# Patient Record
Sex: Male | Born: 1950 | Race: White | Hispanic: No | Marital: Married | Smoking: Former smoker
Health system: Southern US, Community
[De-identification: ages and names within clinical notes are randomized; demographics above are authoritative.]

## PROBLEM LIST (undated history)

## (undated) DIAGNOSIS — I639 Cerebral infarction, unspecified: Secondary | ICD-10-CM

## (undated) DIAGNOSIS — E119 Type 2 diabetes mellitus without complications: Secondary | ICD-10-CM

---

## 2019-12-05 ENCOUNTER — Other Ambulatory Visit: Payer: Self-pay

## 2019-12-05 DIAGNOSIS — Z20822 Contact with and (suspected) exposure to covid-19: Secondary | ICD-10-CM

## 2019-12-07 LAB — NOVEL CORONAVIRUS, NAA: SARS-CoV-2, NAA: NOT DETECTED

## 2019-12-09 ENCOUNTER — Telehealth: Payer: Self-pay

## 2019-12-09 NOTE — Telephone Encounter (Signed)
Caller given negative result and verbalized understanding  

## 2019-12-28 DIAGNOSIS — I480 Paroxysmal atrial fibrillation: Secondary | ICD-10-CM

## 2019-12-28 HISTORY — DX: Paroxysmal atrial fibrillation: I48.0

## 2021-03-13 DIAGNOSIS — I69351 Hemiplegia and hemiparesis following cerebral infarction affecting right dominant side: Secondary | ICD-10-CM

## 2021-03-13 HISTORY — DX: Hemiplegia and hemiparesis following cerebral infarction affecting right dominant side: I69.351

## 2021-07-17 ENCOUNTER — Encounter (HOSPITAL_BASED_OUTPATIENT_CLINIC_OR_DEPARTMENT_OTHER): Payer: Self-pay

## 2021-07-17 ENCOUNTER — Emergency Department (HOSPITAL_BASED_OUTPATIENT_CLINIC_OR_DEPARTMENT_OTHER): Payer: Medicare HMO

## 2021-07-17 ENCOUNTER — Other Ambulatory Visit: Payer: Self-pay

## 2021-07-17 ENCOUNTER — Inpatient Hospital Stay (HOSPITAL_BASED_OUTPATIENT_CLINIC_OR_DEPARTMENT_OTHER)
Admission: EM | Admit: 2021-07-17 | Discharge: 2021-07-23 | DRG: 690 | Disposition: A | Discharge status: Home / Self Care | Payer: Medicare HMO | Attending: Family Medicine | Admitting: Family Medicine

## 2021-07-17 DIAGNOSIS — R338 Other retention of urine: Secondary | ICD-10-CM | POA: Diagnosis present

## 2021-07-17 DIAGNOSIS — Z79899 Other long term (current) drug therapy: Secondary | ICD-10-CM

## 2021-07-17 DIAGNOSIS — K5641 Fecal impaction: Secondary | ICD-10-CM | POA: Diagnosis present

## 2021-07-17 DIAGNOSIS — E860 Dehydration: Secondary | ICD-10-CM | POA: Diagnosis present

## 2021-07-17 DIAGNOSIS — I4891 Unspecified atrial fibrillation: Secondary | ICD-10-CM | POA: Diagnosis present

## 2021-07-17 DIAGNOSIS — R310 Gross hematuria: Secondary | ICD-10-CM | POA: Diagnosis present

## 2021-07-17 DIAGNOSIS — Z794 Long term (current) use of insulin: Secondary | ICD-10-CM | POA: Diagnosis not present

## 2021-07-17 DIAGNOSIS — Z880 Allergy status to penicillin: Secondary | ICD-10-CM

## 2021-07-17 DIAGNOSIS — K7030 Alcoholic cirrhosis of liver without ascites: Secondary | ICD-10-CM | POA: Diagnosis present

## 2021-07-17 DIAGNOSIS — I10 Essential (primary) hypertension: Secondary | ICD-10-CM | POA: Diagnosis present

## 2021-07-17 DIAGNOSIS — E876 Hypokalemia: Secondary | ICD-10-CM | POA: Diagnosis not present

## 2021-07-17 DIAGNOSIS — Z87891 Personal history of nicotine dependence: Secondary | ICD-10-CM | POA: Diagnosis not present

## 2021-07-17 DIAGNOSIS — N401 Enlarged prostate with lower urinary tract symptoms: Secondary | ICD-10-CM | POA: Diagnosis present

## 2021-07-17 DIAGNOSIS — B952 Enterococcus as the cause of diseases classified elsewhere: Secondary | ICD-10-CM | POA: Diagnosis present

## 2021-07-17 DIAGNOSIS — E785 Hyperlipidemia, unspecified: Secondary | ICD-10-CM | POA: Diagnosis present

## 2021-07-17 DIAGNOSIS — N390 Urinary tract infection, site not specified: Secondary | ICD-10-CM | POA: Diagnosis not present

## 2021-07-17 DIAGNOSIS — L89510 Pressure ulcer of right ankle, unstageable: Secondary | ICD-10-CM | POA: Diagnosis present

## 2021-07-17 DIAGNOSIS — E119 Type 2 diabetes mellitus without complications: Secondary | ICD-10-CM | POA: Diagnosis present

## 2021-07-17 DIAGNOSIS — I69354 Hemiplegia and hemiparesis following cerebral infarction affecting left non-dominant side: Secondary | ICD-10-CM | POA: Diagnosis not present

## 2021-07-17 DIAGNOSIS — N179 Acute kidney failure, unspecified: Secondary | ICD-10-CM | POA: Diagnosis present

## 2021-07-17 DIAGNOSIS — Z20822 Contact with and (suspected) exposure to covid-19: Secondary | ICD-10-CM | POA: Diagnosis present

## 2021-07-17 DIAGNOSIS — Z8673 Personal history of transient ischemic attack (TIA), and cerebral infarction without residual deficits: Secondary | ICD-10-CM | POA: Diagnosis not present

## 2021-07-17 DIAGNOSIS — N136 Pyonephrosis: Secondary | ICD-10-CM | POA: Diagnosis present

## 2021-07-17 DIAGNOSIS — N1330 Unspecified hydronephrosis: Secondary | ICD-10-CM | POA: Diagnosis not present

## 2021-07-17 DIAGNOSIS — Z7401 Bed confinement status: Secondary | ICD-10-CM

## 2021-07-17 HISTORY — DX: Type 2 diabetes mellitus without complications: E11.9

## 2021-07-17 HISTORY — DX: Cerebral infarction, unspecified: I63.9

## 2021-07-17 LAB — CBC WITH DIFFERENTIAL/PLATELET
Abs Immature Granulocytes: 0.06 10*3/uL (ref 0.00–0.07)
Basophils Absolute: 0.1 10*3/uL (ref 0.0–0.1)
Basophils Relative: 0 %
Eosinophils Absolute: 0 10*3/uL (ref 0.0–0.5)
Eosinophils Relative: 0 %
HCT: 38.3 % — ABNORMAL LOW (ref 39.0–52.0)
Hemoglobin: 13.1 g/dL (ref 13.0–17.0)
Immature Granulocytes: 0 %
Lymphocytes Relative: 15 %
Lymphs Abs: 2.1 10*3/uL (ref 0.7–4.0)
MCH: 31.1 pg (ref 26.0–34.0)
MCHC: 34.2 g/dL (ref 30.0–36.0)
MCV: 91 fL (ref 80.0–100.0)
Monocytes Absolute: 1.1 10*3/uL — ABNORMAL HIGH (ref 0.1–1.0)
Monocytes Relative: 8 %
Neutro Abs: 10.2 10*3/uL — ABNORMAL HIGH (ref 1.7–7.7)
Neutrophils Relative %: 77 %
Platelets: 318 10*3/uL (ref 150–400)
RBC: 4.21 MIL/uL — ABNORMAL LOW (ref 4.22–5.81)
RDW: 12.7 % (ref 11.5–15.5)
WBC: 13.4 10*3/uL — ABNORMAL HIGH (ref 4.0–10.5)
nRBC: 0 % (ref 0.0–0.2)

## 2021-07-17 LAB — URINALYSIS, MICROSCOPIC (REFLEX): WBC, UA: >50 WBC/hpf (ref 0–5)

## 2021-07-17 LAB — URINALYSIS, ROUTINE W REFLEX MICROSCOPIC
Bilirubin Urine: NEGATIVE
Glucose, UA: NEGATIVE mg/dL
Ketones, ur: NEGATIVE mg/dL
Nitrite: NEGATIVE
Protein, ur: 100 mg/dL — AB
Specific Gravity, Urine: 1.02 (ref 1.005–1.030)
pH: 5 (ref 5.0–8.0)

## 2021-07-17 LAB — COMPREHENSIVE METABOLIC PANEL
ALT: 11 U/L (ref 0–44)
AST: 17 U/L (ref 15–41)
Albumin: 3.3 g/dL — ABNORMAL LOW (ref 3.5–5.0)
Alkaline Phosphatase: 66 U/L (ref 38–126)
Anion gap: 13 (ref 5–15)
BUN: 40 mg/dL — ABNORMAL HIGH (ref 8–23)
CO2: 23 mmol/L (ref 22–32)
Calcium: 9.3 mg/dL (ref 8.9–10.3)
Chloride: 94 mmol/L — ABNORMAL LOW (ref 98–111)
Creatinine, Ser: 2.57 mg/dL — ABNORMAL HIGH (ref 0.61–1.24)
GFR, Estimated: 26 mL/min — ABNORMAL LOW (ref >60)
Glucose, Bld: 127 mg/dL — ABNORMAL HIGH (ref 70–99)
Potassium: 4.7 mmol/L (ref 3.5–5.1)
Sodium: 130 mmol/L — ABNORMAL LOW (ref 135–145)
Total Bilirubin: 0.7 mg/dL (ref 0.3–1.2)
Total Protein: 8.3 g/dL — ABNORMAL HIGH (ref 6.5–8.1)

## 2021-07-17 LAB — LIPASE, BLOOD: Lipase: 102 U/L — ABNORMAL HIGH (ref 11–51)

## 2021-07-17 LAB — RESP PANEL BY RT-PCR (FLU A&B, COVID) ARPGX2
Influenza A by PCR: NEGATIVE
Influenza B by PCR: NEGATIVE
SARS Coronavirus 2 by RT PCR: NEGATIVE

## 2021-07-17 MED ORDER — ACETAMINOPHEN 650 MG RE SUPP: 650.0000 mg | Freq: Four times a day (QID) | RECTAL | Status: DC | PRN

## 2021-07-17 MED ORDER — ONDANSETRON HCL 4 MG/2ML IJ SOLN: 4.0000 mg | Freq: Four times a day (QID) | INTRAMUSCULAR | Status: DC | PRN

## 2021-07-17 MED ORDER — ONDANSETRON HCL 4 MG PO TABS: 4.0000 mg | ORAL_TABLET | Freq: Four times a day (QID) | ORAL | Status: DC | PRN

## 2021-07-17 MED ORDER — SODIUM CHLORIDE 0.9 % IV SOLN
1.0000 g | Freq: Once | INTRAVENOUS | Status: AC
  Administered 2021-07-17: 1 g via INTRAVENOUS
  Filled 2021-07-17: qty 10

## 2021-07-17 MED ORDER — METOPROLOL SUCCINATE ER 100 MG PO TB24
200.0000 mg | ORAL_TABLET | Freq: Every day | ORAL | Status: DC
  Administered 2021-07-18 – 2021-07-23 (×6): 200 mg via ORAL
  Filled 2021-07-17 (×6): qty 2

## 2021-07-17 MED ORDER — ACETAMINOPHEN 325 MG PO TABS: 650.0000 mg | ORAL_TABLET | Freq: Four times a day (QID) | ORAL | Status: DC | PRN

## 2021-07-17 MED ORDER — LISINOPRIL 20 MG PO TABS: 20.0000 mg | ORAL_TABLET | Freq: Every day | ORAL | Status: DC

## 2021-07-17 MED ORDER — SODIUM CHLORIDE 0.9 % IV SOLN
1.0000 g | INTRAVENOUS | Status: DC
  Administered 2021-07-18 – 2021-07-21 (×4): 1 g via INTRAVENOUS
  Filled 2021-07-17 (×4): qty 1

## 2021-07-17 MED ORDER — TRAZODONE HCL 50 MG PO TABS: 25.0000 mg | ORAL_TABLET | Freq: Every evening | ORAL | Status: DC | PRN

## 2021-07-17 MED ORDER — BISACODYL 5 MG PO TBEC: 5.0000 mg | DELAYED_RELEASE_TABLET | Freq: Every day | ORAL | Status: DC | PRN

## 2021-07-17 MED ORDER — MORPHINE SULFATE (PF) 2 MG/ML IV SOLN: 1.0000 mg | Freq: Four times a day (QID) | INTRAVENOUS | Status: DC | PRN

## 2021-07-17 MED ORDER — INSULIN ASPART 100 UNIT/ML IJ SOLN
0.0000 [IU] | Freq: Three times a day (TID) | INTRAMUSCULAR | Status: DC
  Administered 2021-07-18: 5 [IU] via SUBCUTANEOUS
  Administered 2021-07-18: 3 [IU] via SUBCUTANEOUS
  Administered 2021-07-18: 5 [IU] via SUBCUTANEOUS
  Administered 2021-07-19: 3 [IU] via SUBCUTANEOUS
  Administered 2021-07-19: 2 [IU] via SUBCUTANEOUS
  Administered 2021-07-19: 5 [IU] via SUBCUTANEOUS
  Administered 2021-07-20 (×2): 3 [IU] via SUBCUTANEOUS
  Administered 2021-07-20: 2 [IU] via SUBCUTANEOUS
  Administered 2021-07-21 – 2021-07-22 (×4): 3 [IU] via SUBCUTANEOUS
  Administered 2021-07-22: 8 [IU] via SUBCUTANEOUS
  Administered 2021-07-22: 2 [IU] via SUBCUTANEOUS
  Administered 2021-07-23: 5 [IU] via SUBCUTANEOUS
  Administered 2021-07-23 (×2): 2 [IU] via SUBCUTANEOUS

## 2021-07-17 MED ORDER — SODIUM CHLORIDE 0.9 % IV BOLUS
500.0000 mL | Freq: Once | INTRAVENOUS | Status: AC
  Administered 2021-07-17: 500 mL via INTRAVENOUS

## 2021-07-17 MED ORDER — HYDROCODONE-ACETAMINOPHEN 5-325 MG PO TABS: 1.0000 | ORAL_TABLET | ORAL | Status: DC | PRN

## 2021-07-17 MED ORDER — SODIUM CHLORIDE 0.9 % IV SOLN: INTRAVENOUS | Status: DC

## 2021-07-17 MED ORDER — MAGNESIUM CITRATE PO SOLN
1.0000 | Freq: Once | ORAL | Status: AC | PRN
  Administered 2021-07-20: 1 via ORAL
  Filled 2021-07-17: qty 296

## 2021-07-17 MED ORDER — INSULIN ASPART 100 UNIT/ML IJ SOLN
0.0000 [IU] | Freq: Every day | INTRAMUSCULAR | Status: DC
  Administered 2021-07-22: 2 [IU] via SUBCUTANEOUS

## 2021-07-17 MED ORDER — FUROSEMIDE 20 MG PO TABS: 20.0000 mg | ORAL_TABLET | Freq: Every day | ORAL | Status: DC

## 2021-07-17 MED ORDER — SENNOSIDES-DOCUSATE SODIUM 8.6-50 MG PO TABS: 1.0000 | ORAL_TABLET | Freq: Every evening | ORAL | Status: DC | PRN

## 2021-07-17 MED ORDER — ROSUVASTATIN CALCIUM 20 MG PO TABS
40.0000 mg | ORAL_TABLET | Freq: Every day | ORAL | Status: DC
  Administered 2021-07-18 – 2021-07-23 (×6): 40 mg via ORAL
  Filled 2021-07-17 (×6): qty 2

## 2021-07-17 MED ORDER — ENOXAPARIN SODIUM 30 MG/0.3ML IJ SOSY
30.0000 mg | PREFILLED_SYRINGE | INTRAMUSCULAR | Status: DC
  Administered 2021-07-18: 30 mg via SUBCUTANEOUS
  Filled 2021-07-17: qty 0.3

## 2021-07-17 MED ORDER — ALBUTEROL SULFATE (2.5 MG/3ML) 0.083% IN NEBU: 2.5000 mg | INHALATION_SOLUTION | Freq: Four times a day (QID) | RESPIRATORY_TRACT | Status: DC | PRN

## 2021-07-17 MED ORDER — IPRATROPIUM BROMIDE 0.02 % IN SOLN: 0.5000 mg | Freq: Four times a day (QID) | RESPIRATORY_TRACT | Status: DC | PRN

## 2021-07-17 NOTE — ED Provider Notes (Signed)
[facility] EMERGENCY DEPARTMENT Provider Note   CSN: [id number] Arrival date & time: 07/17/21  1058     History Chief Complaint  Patient presents with   Abdominal Pain    Dylan Park is a 70 y.o. male.  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  He presents with constipation.  He states he has not had a bowel movement in 12 days.  He is essentially bedbound after stroke about 3 months ago.  He does not have any prior history of constipation.  He does have some associated abdominal distention.  He does not report any abdominal pain.  No nausea or vomiting.  No fevers.  He has been using some over-the-counter medications at home including fleets enema without improvement in symptoms.  He has a slow healing wound on his right ankle that is being cared for through home health services.  No change in this.      Past Medical History:  Diagnosis Date   Diabetes mellitus without complication (HCC)    Stroke (HCC)     There are no problems to display for this patient.   History reviewed. No pertinent surgical history.     No family history on file.  Social History   Tobacco Use   Smoking status: Former    Types: Cigarettes   Smokeless tobacco: Never  Vaping Use   Vaping Use: Never used  Substance Use Topics   Alcohol use: Never   Drug use: Never    Home Medications Prior to Admission medications   Not on File    Allergies    Penicillins  Review of Systems   Review of Systems  Constitutional:  Negative for chills, diaphoresis, fatigue and fever.  HENT:  Negative for congestion, rhinorrhea and sneezing.   Eyes: Negative.   Respiratory:  Negative for cough, chest tightness and shortness of breath.   Cardiovascular:  Negative for chest pain and leg swelling.  Gastrointestinal:  Positive for abdominal distention and constipation. Negative for abdominal  pain, blood in stool, diarrhea, nausea and vomiting.  Genitourinary:  Negative for difficulty urinating, flank pain, frequency and hematuria.  Musculoskeletal:  Negative for arthralgias and back pain.  Skin:  Negative for rash.  Neurological:  Negative for dizziness, speech difficulty, weakness, numbness and headaches.   Physical Exam Updated Vital Signs BP 119/81 (BP Location: Right Arm)   Pulse 93   Temp 99 F (37.2 C) (Oral)   Resp 16   Ht 5\' 11"  (1.803 m)   Wt 77.1 kg   SpO2 97%   BMI 23.71 kg/m   Physical Exam Constitutional:      Appearance: He is well-developed.  HENT:     Head: Normocephalic and atraumatic.  Eyes:     Pupils: Pupils are equal, round, and reactive to light.  Cardiovascular:     Rate and Rhythm: Normal rate and regular rhythm.     Heart sounds: Normal heart sounds.  Pulmonary:     Effort: Pulmonary effort is normal. No respiratory distress.     Breath sounds: Normal breath sounds. No wheezing or rales.  Chest:     Chest wall: No tenderness.  Abdominal:     General: Bowel sounds are normal.     Palpations: Abdomen is soft.     Tenderness: There is generalized abdominal tenderness. There is no guarding or rebound.  Genitourinary:    Comments: Patient does have  some hard stool high up in the rectal vault. Musculoskeletal:        General: Normal range of motion.     Cervical back: Normal range of motion and neck supple.  Lymphadenopathy:     Cervical: No cervical adenopathy.  Skin:    General: Skin is warm and dry.     Findings: No rash.  Neurological:     Mental Status: He is alert and oriented to person, place, and time.    ED Results / Procedures / Treatments   Labs (all labs ordered are listed, but only abnormal results are displayed) Labs Reviewed  COMPREHENSIVE METABOLIC PANEL - Abnormal; Notable for the following components:      Result Value   Sodium 130 (*)    Chloride 94 (*)    Glucose, Bld 127 (*)    BUN 40 (*)    Creatinine,  Ser 2.57 (*)    Total Protein 8.3 (*)    Albumin 3.3 (*)    GFR, Estimated 26 (*)    All other components within normal limits  LIPASE, BLOOD - Abnormal; Notable for the following components:   Lipase 102 (*)    All other components within normal limits  CBC WITH DIFFERENTIAL/PLATELET - Abnormal; Notable for the following components:   WBC 13.4 (*)    RBC 4.21 (*)    HCT 38.3 (*)    Neutro Abs 10.2 (*)    Monocytes Absolute 1.1 (*)    All other components within normal limits    EKG None  Radiology No results found.  Procedures Procedures   Medications Ordered in ED Medications - No data to display  ED Course  I have reviewed the triage vital signs and the nursing notes.  Pertinent labs & imaging results that were available during my care of the patient were reviewed by me and considered in my medical decision making (see chart for details).    MDM Rules/Calculators/A&P                           Patient is a 70 year old male who presents with constipation.  He has no prior history of constipation.  No vomiting.  He denies any abdominal pain but did have some tenderness on exam.  His white count is slightly elevated.  His creatinine is elevated as compared to his prior normal values.  CT scan was ordered and is pending.  Dr. Madilyn Hook to take over care. Final Clinical Impression(s) / ED Diagnoses Final diagnoses:  None    Rx / DC Orders ED Discharge Orders     None        Rolan Bucco, MD 07/18/21 [phone]

## 2021-07-17 NOTE — ED Notes (Signed)
Pt states that he is bed bound at home after his stroke about 3 months ago, was cared for at [facility] for the stroke. Pt is poor historian but reports wife is on the way. Also unsure of medications he is currently on.

## 2021-07-17 NOTE — ED Notes (Signed)
Pt. In xray 

## 2021-07-17 NOTE — ED Triage Notes (Signed)
Pt arrives from home with c/o constipation X12 days with abdominal distention. Pt also has right foot bandaged. Family reports using OTC medications for constipation, none working. Per EMS  Pt has firm area to touch in middle of abdomen above umbilical area. Pt denies any vomiting.

## 2021-07-17 NOTE — H&P (Signed)
History and Physical   [facility] [place] [facility] [place] [facility] Admission History and Physical [employer], D.O.    Patient Name: Dylan Park MR#: [id number] Date of Birth: [birth year] Date of Admission: 07/17/2021  Referring MD/NP/PA: [facility] Primary Care Physician: Elijio Miles., MD  Chief Complaint:  Chief Complaint  Patient presents with   Abdominal Pain    HPI: Dylan Park is a 70 y.o. male with a known history of atrial flutter/fibrillation on Eliquis, alcoholic liver disease, CVA with residual hemiparesis, bedbound for 3 months, diabetes, CHF presents to the emergency department for evaluation of constipation, stated abdominal pain.  Patient reports he has not had a bowel movement in 12 days.  Symptoms are refractory to home enemas  Of note he has home care for ongoing treatment of a right ankle wound  Patient denies fevers/chills, weakness, dizziness, chest pain, shortness of breath, N/V/C/D, abdominal pain, dysuria/frequency, changes in mental status.    Otherwise there has been no change in status. Patient has been taking medication as prescribed and there has been no recent change in medication or diet.  No recent antibiotics.  There has been no recent illness, hospitalizations, travel or sick contacts.    EMS/ED Course:Medical admission has been requested for further management of fecal impaction, urinary retention likely secondary to stool ball, AKI secondary to dehydration and urinary tract infection..  Review of Systems:  CONSTITUTIONAL: No fever/chills, fatigue, weakness, weight gain/loss, headache. EYES: No blurry or double vision. ENT: No tinnitus, postnasal drip, redness or soreness of the oropharynx. RESPIRATORY: No cough, dyspnea, wheeze.  No hemoptysis.  CARDIOVASCULAR: No chest pain, palpitations, syncope, orthopnea. No lower extremity edema.  GASTROINTESTINAL: Positive abdominal pain, constipation no nausea, vomiting,.  No  hematemesis, melena or hematochezia. GENITOURINARY: No dysuria, frequency, hematuria. ENDOCRINE: No polyuria or nocturia. No heat or cold intolerance. HEMATOLOGY: No anemia, bruising, bleeding. INTEGUMENTARY: No rashes, ulcers, lesions. MUSCULOSKELETAL: No arthritis, gout, dyspnea. NEUROLOGIC: No numbness, tingling, ataxia, seizure-type activity, weakness. PSYCHIATRIC: No anxiety, depression, insomnia.   Past Medical History:  Diagnosis Date   Diabetes mellitus without complication (HCC)    Stroke [facility])     History reviewed. No pertinent surgical history.   reports that he has quit smoking. His smoking use included cigarettes. He has never used smokeless tobacco. He reports that he does not drink alcohol and does not use drugs.  Allergies  Allergen Reactions   Penicillins Rash    No family history on file.  Prior to Admission medications   Medication Sig Start Date End Date Taking? Authorizing Provider  furosemide (LASIX) 20 MG tablet Take by mouth. 05/19/21   [provider]  ketoconazole (NIZORAL) 2 % cream Apply topically. 06/20/21   [provider]  LEVEMIR 100 UNIT/ML injection Inject into the skin. 05/04/21   [provider]  lisinopril (ZESTRIL) 20 MG tablet Take 20 mg by mouth daily. 05/05/21   [provider]  metoprolol (TOPROL-XL) 200 MG 24 hr tablet Take 200 mg by mouth daily. 04/19/21   [provider]  rosuvastatin (CRESTOR) 40 MG tablet Take 40 mg by mouth daily. 06/29/21   [provider]  SANTYL ointment Apply topically. 07/08/21   [provider]    Physical Exam: Vitals:   07/17/21 1930 07/17/21 2046 07/17/21 2100 07/17/21 2205  BP: 116/81 125/77 124/80 116/75  Pulse: (!) 103 (!) 107 (!) 103 (!) 103  Resp: 20 18 20 18   Temp:    98.7 F (37.1  C)  TempSrc:    Oral  SpO2: 95% 95% 95% 98%  Weight:    74.3 kg  Height:    5\' 11"  (1.803 m)    GENERAL: 70 y.o.-year-old chronically ill-appearing male  patient, well-developed, well-nourished lying in the bed in no acute distress.  Pleasant and cooperative.   HEENT: Head atraumatic, normocephalic. Pupils equal. Mucus membranes moist. NECK: Supple. No JVD. CHEST: Normal breath sounds bilaterally. No wheezing, rales, rhonchi or crackles. No use of accessory muscles of respiration.  No reproducible chest wall tenderness.  CARDIOVASCULAR: S1, S2 normal. No murmurs, rubs, or gallops. Cap refill <2 seconds. Pulses intact distally.  ABDOMEN: Soft, nondistended, nontender. No rebound, guarding, rigidity. Normoactive bowel sounds present in all four quadrants.  EXTREMITIES: Lower extremity wound.  No pedal edema, cyanosis, or clubbing. No calf tenderness or Homan's sign.  NEUROLOGIC: The patient is alert and oriented x 3.  PSYCHIATRIC:  Normal affect, mood, thought content. SKIN: Warm, dry, and intact without obvious rash, lesion, or ulcer.    Labs on Admission:  CBC: Recent Labs  Lab 07/17/21 1124  WBC 13.4*  NEUTROABS 10.2*  HGB 13.1  HCT 38.3*  MCV 91.0  PLT 318   Basic Metabolic Panel: Recent Labs  Lab 07/17/21 1124  NA 130*  K 4.7  CL 94*  CO2 23  GLUCOSE 127*  BUN 40*  CREATININE 2.57*  CALCIUM 9.3   GFR: Estimated Creatinine Clearance: 28.5 mL/min (A) (by C-G formula based on SCr of 2.57 mg/dL (H)). Liver Function Tests: Recent Labs  Lab 07/17/21 1124  AST 17  ALT 11  ALKPHOS 66  BILITOT 0.7  PROT 8.3*  ALBUMIN 3.3*   Recent Labs  Lab 07/17/21 1124  LIPASE 102*   No results for input(s): AMMONIA in the last 168 hours. Coagulation Profile: No results for input(s): INR, PROTIME in the last 168 hours. Cardiac Enzymes: No results for input(s): CKTOTAL, CKMB, CKMBINDEX, TROPONINI in the last 168 hours. BNP (last 3 results) No results for input(s): PROBNP in the last 8760 hours. HbA1C: No results for input(s): HGBA1C in the last 72 hours. CBG: No results for input(s): GLUCAP in the last 168 hours. Lipid  Profile: No results for input(s): CHOL, HDL, LDLCALC, TRIG, CHOLHDL, LDLDIRECT in the last 72 hours. Thyroid Function Tests: No results for input(s): TSH, T4TOTAL, FREET4, T3FREE, THYROIDAB in the last 72 hours. Anemia Panel: No results for input(s): VITAMINB12, FOLATE, FERRITIN, TIBC, IRON, RETICCTPCT in the last 72 hours. Urine analysis:    Component Value Date/Time   COLORURINE YELLOW 07/17/2021 1540   APPEARANCEUR TURBID (A) 07/17/2021 1540   LABSPEC 1.020 07/17/2021 1540   PHURINE 5.0 07/17/2021 1540   GLUCOSEU NEGATIVE 07/17/2021 1540   HGBUR LARGE (A) 07/17/2021 1540   BILIRUBINUR NEGATIVE 07/17/2021 1540   KETONESUR NEGATIVE 07/17/2021 1540   PROTEINUR 100 (A) 07/17/2021 1540   NITRITE NEGATIVE 07/17/2021 1540   LEUKOCYTESUR LARGE (A) 07/17/2021 1540   Sepsis Labs: @LABRCNTIP (procalcitonin:4,lacticidven:4) ) Recent Results (from the past 240 hour(s))  Resp Panel by RT-PCR (Flu A&B, Covid) Nasopharyngeal Swab     Status: None   Collection Time: 07/17/21  4:05 PM   Specimen: Nasopharyngeal Swab; Nasopharyngeal(NP) swabs in vial transport medium  Result Value Ref Range Status   SARS Coronavirus 2 by RT PCR NEGATIVE NEGATIVE Final    Comment: (NOTE) SARS-CoV-2 target nucleic acids are NOT DETECTED.  The SARS-CoV-2 RNA is generally detectable in upper respiratory specimens during the acute phase of infection. The lowest  concentration of SARS-CoV-2 viral copies this assay can detect is 138 copies/mL. A negative result does not preclude SARS-Cov-2 infection and should not be used as the sole basis for treatment or other patient management decisions. A negative result may occur with  improper specimen collection/handling, submission of specimen other than nasopharyngeal swab, presence of viral mutation(s) within the areas targeted by this assay, and inadequate number of viral copies(<138 copies/mL). A negative result must be combined with clinical observations, patient  history, and epidemiological information. The expected result is Negative.  Fact Sheet for Patients:  BloggerCourse.comhttps://www.fda.gov/media/152166/download  Fact Sheet for Healthcare Providers:  SeriousBroker.ithttps://www.fda.gov/media/152162/download  This test is no t yet approved or cleared by the [place] FDA and  has been authorized for detection and/or diagnosis of SARS-CoV-2 by FDA under an Emergency Use Authorization (EUA). This EUA will remain  in effect (meaning this test can be used) for the duration of the COVID-19 declaration under Section 564(b)(1) of the Act, 21 U.S.C.section 360bbb-3(b)(1), unless the authorization is terminated  or revoked sooner.       Influenza A by PCR NEGATIVE NEGATIVE Final   Influenza B by PCR NEGATIVE NEGATIVE Final    Comment: (NOTE) The Xpert Xpress SARS-CoV-2/FLU/RSV plus assay is intended as an aid in the diagnosis of influenza from Nasopharyngeal swab specimens and should not be used as a sole basis for treatment. Nasal washings and aspirates are unacceptable for Xpert Xpress SARS-CoV-2/FLU/RSV testing.  Fact Sheet for Patients: BloggerCourse.comhttps://www.fda.gov/media/152166/download  Fact Sheet for Healthcare Providers: SeriousBroker.ithttps://www.fda.gov/media/152162/download  This test is not yet approved or cleared by the [place] FDA and has been authorized for detection and/or diagnosis of SARS-CoV-2 by FDA under an Emergency Use Authorization (EUA). This EUA will remain in effect (meaning this test can be used) for the duration of the COVID-19 declaration under Section 564(b)(1) of the Act, 21 U.S.C. section 360bbb-3(b)(1), unless the authorization is terminated or revoked.  Performed at [facility], [address] [place], [place] [address]      Radiological Exams on Admission: CT Abdomen Pelvis Wo Contrast  Result Date: 07/17/2021 CLINICAL DATA:  Abdominal distension, constipation. EXAM: CT ABDOMEN AND PELVIS WITHOUT CONTRAST TECHNIQUE:  Multidetector CT imaging of the abdomen and pelvis was performed following the standard protocol without IV contrast. COMPARISON:  None. FINDINGS: Lower chest: No acute abnormality. Hepatobiliary: Cholelithiasis is noted. No biliary dilatation is noted. The liver is unremarkable. Pancreas: Unremarkable. No pancreatic ductal dilatation or surrounding inflammatory changes. Spleen: Normal in size without focal abnormality. Adrenals/Urinary Tract: Adrenal glands appear normal. Mild bilateral hydroureteronephrosis is noted. Severe urinary bladder distention is noted concerning for bladder outlet obstruction. Stomach/Bowel: The stomach appears normal. No small bowel dilatation is noted. Large amount of stool seen throughout the colon. The appendix appears normal. Large amount of stool is noted in the rectum concerning for rectal impaction. Vascular/Lymphatic: Aortic atherosclerosis. No enlarged abdominal or pelvic lymph nodes. Reproductive: Mild prostatic enlargement is noted. Other: No abdominal wall hernia or abnormality. No abdominopelvic ascites. Musculoskeletal: No acute or significant osseous findings. IMPRESSION: Mild bilateral hydroureteronephrosis is noted secondary to severe urinary bladder distension, which in turn most likely is due to bladder outlet obstruction, potentially due to enlarged prostate gland. Cholelithiasis. Large amount of stool seen in the rectum concerning for rectal impaction. Aortic Atherosclerosis (ICD10-I70.0). Electronically Signed   By: Lupita RaiderJames  Green Jr M.D.   On: 07/17/2021 14:52   DG ABD ACUTE 2+V W 1V CHEST  Result Date: 07/17/2021 CLINICAL DATA:  Abdominal  pain and constipation EXAM: DG ABDOMEN ACUTE WITH 1 VIEW CHEST COMPARISON:  11/19/2013 FINDINGS: Cardiac shadow is within normal limits. The lungs are well aerated bilaterally. Vascular calcifications are seen. No acute bony abnormality is noted. Scattered large and small bowel gas is noted. Retained fecal material is noted in  the right colon consistent with a mild degree of constipation. Heavy vascular calcifications are noted. No free air is noted. No obstructive changes are seen. IMPRESSION: Changes of mild constipation. Aortic Atherosclerosis (ICD10-I70.0). Electronically Signed   By: Alcide Clever M.D.   On: 07/17/2021 12:07     Assessment/Plan  This is a 69 yo M history of: atrial flutter/fibrillation, alcoholic liver disease, CVA with residual hemiparesis, bedbound for 3 months, diabetes, CHF now being admitted for  #.  Fecal impaction -Admit inpatient - Stool regimen  #.  AKI secondary to dehydration  - IV fluids and repeat BMP in AM.  - Avoid nephrotoxic medications  #.  Urinary tract infection - IV Rocephin  #.  Urinary retention with bilateral hydronephrosis - Foley catheter in place - Check Is/Os  #. History of hypertension/CHF - Continue Lasix, lisinopril, metoprolol  #. History of hyperlipidemia - Continue Crestor  #. History of atrial fibrillation - Continue Toprol  Admission status: Inpatient IV Fluids: Gentle normal saline Diet/Nutrition: Heart healthy, carb controlled Consults called: None DVT Px: Lovenox, SCDs and early ambulation. Code Status: Full Code  Disposition Plan: To home in 1-2 days  All the records are reviewed and case discussed with ED provider. Management plans discussed with the patient and/or family who express understanding and agree with plan of care.  Tonye Royalty D.O. on 07/17/2021 at 10:33 PM CC: Primary care physician; Elijio Miles., MD   07/17/2021, 10:33 PM

## 2021-07-17 NOTE — ED Provider Notes (Signed)
Patient care assumed at 1500.  Pt here with abdominal pain, constipation.  CT pending.    CT demonstrates enlarged bladder with bilateral hydronephrosis as well as stool ball. Foley catheter was placed for urinary retention. UA is consistent with UTI - will start antibiotics. Will treat with enema for constipation. Discussed with patient and wife findings of studies and recommendation for admission for ongoing treatment. Hospitalist consulted for admission.   Tilden Fossa, MD 07/17/21 1806

## 2021-07-17 NOTE — ED Notes (Signed)
Pt has no rectal tone or ability to hold instilled enema.Dylan Park colored water returned after enema however no BM present.

## 2021-07-17 NOTE — ED Notes (Signed)
Non stick dressing placed to pressure wound on R lateral ankle malleolus. Measures approx 1.8cm x 1.3cm oval shaped stage 3.

## 2021-07-18 ENCOUNTER — Encounter (HOSPITAL_COMMUNITY): Payer: Self-pay | Admitting: Family Medicine

## 2021-07-18 LAB — HEMOGLOBIN A1C
Hgb A1c MFr Bld: 6.3 % — ABNORMAL HIGH (ref 4.8–5.6)
Mean Plasma Glucose: 134.11 mg/dL

## 2021-07-18 LAB — COMPREHENSIVE METABOLIC PANEL
ALT: 11 U/L (ref 0–44)
AST: 14 U/L — ABNORMAL LOW (ref 15–41)
Albumin: 3.1 g/dL — ABNORMAL LOW (ref 3.5–5.0)
Alkaline Phosphatase: 62 U/L (ref 38–126)
Anion gap: 18 — ABNORMAL HIGH (ref 5–15)
BUN: 42 mg/dL — ABNORMAL HIGH (ref 8–23)
CO2: 22 mmol/L (ref 22–32)
Calcium: 9.8 mg/dL (ref 8.9–10.3)
Chloride: 96 mmol/L — ABNORMAL LOW (ref 98–111)
Creatinine, Ser: 2.33 mg/dL — ABNORMAL HIGH (ref 0.61–1.24)
GFR, Estimated: 30 mL/min — ABNORMAL LOW (ref >60)
Glucose, Bld: 194 mg/dL — ABNORMAL HIGH (ref 70–99)
Potassium: 5.6 mmol/L — ABNORMAL HIGH (ref 3.5–5.1)
Sodium: 136 mmol/L (ref 135–145)
Total Bilirubin: 0.8 mg/dL (ref 0.3–1.2)
Total Protein: 7.7 g/dL (ref 6.5–8.1)

## 2021-07-18 LAB — CBC
HCT: 42.5 % (ref 39.0–52.0)
Hemoglobin: 13.9 g/dL (ref 13.0–17.0)
MCH: 30.2 pg (ref 26.0–34.0)
MCHC: 32.7 g/dL (ref 30.0–36.0)
MCV: 92.4 fL (ref 80.0–100.0)
Platelets: 340 10*3/uL (ref 150–400)
RBC: 4.6 MIL/uL (ref 4.22–5.81)
RDW: 12.9 % (ref 11.5–15.5)
WBC: 18 10*3/uL — ABNORMAL HIGH (ref 4.0–10.5)
nRBC: 0 % (ref 0.0–0.2)

## 2021-07-18 LAB — HEMOGLOBIN AND HEMATOCRIT, BLOOD
HCT: 34.6 % — ABNORMAL LOW (ref 39.0–52.0)
HCT: 33.6 % — ABNORMAL LOW (ref 39.0–52.0)
Hemoglobin: 11.6 g/dL — ABNORMAL LOW (ref 13.0–17.0)
Hemoglobin: 11.2 g/dL — ABNORMAL LOW (ref 13.0–17.0)

## 2021-07-18 LAB — BASIC METABOLIC PANEL
Anion gap: 10 (ref 5–15)
BUN: 46 mg/dL — ABNORMAL HIGH (ref 8–23)
CO2: 24 mmol/L (ref 22–32)
Calcium: 8.6 mg/dL — ABNORMAL LOW (ref 8.9–10.3)
Chloride: 100 mmol/L (ref 98–111)
Creatinine, Ser: 2.35 mg/dL — ABNORMAL HIGH (ref 0.61–1.24)
GFR, Estimated: 29 mL/min — ABNORMAL LOW (ref >60)
Glucose, Bld: 171 mg/dL — ABNORMAL HIGH (ref 70–99)
Potassium: 4.8 mmol/L (ref 3.5–5.1)
Sodium: 134 mmol/L — ABNORMAL LOW (ref 135–145)

## 2021-07-18 LAB — HIV ANTIBODY (ROUTINE TESTING W REFLEX): HIV Screen 4th Generation wRfx: NONREACTIVE

## 2021-07-18 LAB — GLUCOSE, CAPILLARY
Glucose-Capillary: 161 mg/dL — ABNORMAL HIGH (ref 70–99)
Glucose-Capillary: 175 mg/dL — ABNORMAL HIGH (ref 70–99)
Glucose-Capillary: 198 mg/dL — ABNORMAL HIGH (ref 70–99)
Glucose-Capillary: 203 mg/dL — ABNORMAL HIGH (ref 70–99)
Glucose-Capillary: 232 mg/dL — ABNORMAL HIGH (ref 70–99)

## 2021-07-18 MED ORDER — SODIUM CHLORIDE 0.9 % IV BOLUS
500.0000 mL | Freq: Once | INTRAVENOUS | Status: AC
  Administered 2021-07-18: 500 mL via INTRAVENOUS

## 2021-07-18 MED ORDER — POLYETHYLENE GLYCOL 3350 17 G PO PACK
17.0000 g | PACK | Freq: Every day | ORAL | Status: DC
  Administered 2021-07-18 – 2021-07-20 (×3): 17 g via ORAL
  Filled 2021-07-18 (×3): qty 1

## 2021-07-18 MED ORDER — BISACODYL 10 MG RE SUPP
10.0000 mg | Freq: Once | RECTAL | Status: AC
  Administered 2021-07-18: 10 mg via RECTAL
  Filled 2021-07-18: qty 1

## 2021-07-18 MED ORDER — CHLORHEXIDINE GLUCONATE CLOTH 2 % EX PADS
6.0000 | MEDICATED_PAD | Freq: Every day | CUTANEOUS | Status: DC
  Administered 2021-07-18 – 2021-07-23 (×6): 6 via TOPICAL

## 2021-07-18 NOTE — Progress Notes (Addendum)
PROGRESS NOTE    Dylan Park  QQV:[id number] DOB: Jan 30, 1951 DOA: 07/17/2021 PCP: Elijio Miles., MD   Chief Complaint  Patient presents with   Abdominal Pain   Brief Narrative: 70 year old male with history of A. fib/stroke bedbound due to stroke x3 months, alcoholic liver disease, diabetes, hypertension, hyperlipidemia, history of CHF seen in the ED due to complaint of constipation abdominal pain for 1 days refractory to home. He was seen in the ED, found to have fecal impaction constipation, AKI hypokalemia, Foley cath was placed and he was tried and was admitted for further management.  Subjective: Seen and examined this morning.  He is alert awake sluggish slow to respond but appears baseline denies any abdominal pain. Has a Foley catheter in place but appears to have gross hematuria, suprapubic area not distended  Assessment & Plan:  AKI. 2/2 dehydration and obstructive uropathy. Baseline creat 0.8 3/39/22 Obstructive uropathy with bilateral hydronephrosis, distended bladder Urine retention BPH Reviewed CT imaging, Foley was placed in the ED having good urine output but grossly hematuric. Creatinine remains stable continue Foley drainage-having good output since Foley with 3400 mL., keep on  IV fluid hydration.  Continue to hold his lisinopril and Lasix. Recent Labs  Lab 07/17/21 1124 07/18/21 0452 07/18/21 0924  CREATININE 2.57* 2.33* 2.35*    Recent Labs  Lab 07/17/21 1124 07/18/21 0452 07/18/21 0924  BUN 40* 42* 46*  CREATININE 2.57* 2.33* 2.35*   Intake/Output Summary (Last 24 hours) at 07/18/2021 1135 Last data filed at 07/18/2021 1030 Gross per 24 hour  Intake 679.32 ml  Output 3400 ml  Net -2720.68 ml     Gross hematuria, likely traumatic from Foley insertion in the setting of BPH: I have discontinued Lovenox, SCD, Protonix and from nephrology consulted continue hydration Ensure Foley is draining.  UTI POA Leukocytosis Continue empiric ceftriaxone, WBC  trending up continue to monitor follow-up culture Recent Labs  Lab 07/17/21 1124 07/18/21 0452  WBC 13.4* 18.0*   Fecal impaction/constipation Enema has been tried.  Add rectal suppository, laxatives.  Can obtain x-ray.  Resolved.  Hypokalemia has resolved her transient hyperkalemia shows resolution on repeat lab  Hyperlipidemia on Crestor  History of A. fib on Toprol  History of a stroke/bedbound Not on antiplatelets or anticoagulation on statin.  Pressure injury POA-.  See below wound care consult Diet Order             Diet heart healthy/carb modified Room service appropriate? Yes; Fluid consistency: Thin  Diet effective now                  Patient's Body mass index is 22.85 kg/m. caloric intake. Pressure Injury 07/17/21 Ankle Right;Lateral Unstageable - Full thickness tissue loss in which the base of the injury is covered by slough (yellow, tan, gray, green or brown) and/or eschar (tan, brown or black) in the wound bed. (Active)  07/17/21 2056  Location: Ankle  Location Orientation: Right;Lateral  Staging: Unstageable - Full thickness tissue loss in which the base of the injury is covered by slough (yellow, tan, gray, green or brown) and/or eschar (tan, brown or black) in the wound bed.  Wound Description (Comments):   Present on Admission: Yes    DVT prophylaxis: SCDs Start: 07/17/21 2251 no chemical prophylaxis due to hematuria Code Status:   Code Status: Full Code  Family Communication: plan of care discussed with patient at bedside. Status is: Inpatient  Remains inpatient appropriate because:IV treatments appropriate due to intensity of illness  or inability to take PO and Inpatient level of care appropriate due to severity of illness  Dispo: The patient is from: Home              Anticipated d/c is to: SNF              Patient currently is not medically stable to d/c.   Difficult to place patient No Unresulted Labs (From admission, onward)    None       Medications reviewed:  Scheduled Meds:  Chlorhexidine Gluconate Cloth  6 each Topical Daily   insulin aspart  0-15 Units Subcutaneous TID WC   insulin aspart  0-5 Units Subcutaneous QHS   metoprolol  200 mg Oral Daily   rosuvastatin  40 mg Oral Daily   Continuous Infusions:  sodium chloride 10 mL/hr at 07/18/21 0042   cefTRIAXone (ROCEPHIN)  IV     Consultants:see note  Procedures:see note Antimicrobials: Anti-infectives (From admission, onward)    Start     Dose/Rate Route Frequency Ordered Stop   07/18/21 1700  cefTRIAXone (ROCEPHIN) 1 g in sodium chloride 0.9 % 100 mL IVPB        1 g 200 mL/hr over 30 Minutes Intravenous Every 24 hours 07/17/21 2250     07/17/21 1715  cefTRIAXone (ROCEPHIN) 1 g in sodium chloride 0.9 % 100 mL IVPB        1 g 200 mL/hr over 30 Minutes Intravenous  Once 07/17/21 1711 07/17/21 1754      Culture/Microbiology    Component Value Date/Time   SDES  07/17/2021 1540    URINE, CATHETERIZED Performed at [facility], [address] [place], [place] [address]    [facility]  07/17/2021 1540    STERILE CONTAINER Performed at [facility], [address], [place], [place] [address]    CULT PENDING 07/17/2021 1540   REPTSTATUS PENDING 07/17/2021 1540    Other culture-see note  Objective: Vitals: Today's Vitals   07/18/21 0154 07/18/21 0603 07/18/21 0800 07/18/21 0933  BP: 112/75 117/78  120/74  Pulse: 94 84  86  Resp: 18 18  18   Temp: 98.6 F (37 C) 98.3 F (36.8 C)  98 F (36.7 C)  TempSrc: Oral Oral  Oral  SpO2: 99% 97%  100%  Weight:      Height:      PainSc:   0-No pain     Intake/Output Summary (Last 24 hours) at 07/18/2021 1020 Last data filed at 07/18/2021 0600 Gross per 24 hour  Intake 646.97 ml  Output 3400 ml  Net -2753.03 ml   Filed Weights   07/17/21 1104 07/17/21 2205  Weight: 77.1 kg 74.3 kg   Weight change:   Intake/Output from previous day: 07/20 0701 - 07/21 0700 In: 647 [I.V.:43.9; IV  Piggyback:603] Out: 3400 [Urine:3400] Intake/Output this shift: No intake/output data recorded. Filed Weights   07/17/21 1104 07/17/21 2205  Weight: 77.1 kg 74.3 kg   Examination: General exam: AAO at baseline, appears frail looking older than stated age. HEENT:Oral mucosa moist, Ear/Nose WNL grossly,dentition normal. Respiratory system: bilaterally diminished,no use of accessory muscle, non tender. Cardiovascular system: S1 & S2 +,No JVD. Gastrointestinal system: Abdomen soft, NT,ND, BS+.Foley catheter in place but appears to have gross hematuria, suprapubic area not distended Nervous System:Alert, awake, moving extremities Extremities: no edema, distal peripheral pulses palpable.  Skin: No rashes,no icterus. MSK: Normal muscle bulk,tone, power Data Reviewed: I have personally reviewed following labs and imaging studies  CBC: Recent Labs  Lab 07/17/21 1124 07/18/21 0452  WBC 13.4* 18.0*  NEUTROABS 10.2*  --   HGB 13.1 13.9  HCT 38.3* 42.5  MCV 91.0 92.4  PLT 318 340   Basic Metabolic Panel: Recent Labs  Lab 07/17/21 1124 07/18/21 0452 07/18/21 0924  NA 130* 136 134*  K 4.7 5.6* 4.8  CL 94* 96* 100  CO2 23 22 24   GLUCOSE 127* 194* 171*  BUN 40* 42* 46*  CREATININE 2.57* 2.33* 2.35*  CALCIUM 9.3 9.8 8.6*   GFR: Estimated Creatinine Clearance: 31.2 mL/min (A) (by C-G formula based on SCr of 2.35 mg/dL (H)). Liver Function Tests: Recent Labs  Lab 07/17/21 1124 07/18/21 0452  AST 17 14*  ALT 11 11  ALKPHOS 66 62  BILITOT 0.7 0.8  PROT 8.3* 7.7  ALBUMIN 3.3* 3.1*   Recent Labs  Lab 07/17/21 1124  LIPASE 102*   No results for input(s): AMMONIA in the last 168 hours. Coagulation Profile: No results for input(s): INR, PROTIME in the last 168 hours. Cardiac Enzymes: No results for input(s): CKTOTAL, CKMB, CKMBINDEX, TROPONINI in the last 168 hours. BNP (last 3 results) No results for input(s): PROBNP in the last 8760 hours. HbA1C: Recent Labs     07/18/21 0452  HGBA1C 6.3*   CBG: Recent Labs  Lab 07/18/21 0010 07/18/21 0723  GLUCAP 175* 198*   Lipid Profile: No results for input(s): CHOL, HDL, LDLCALC, TRIG, CHOLHDL, LDLDIRECT in the last 72 hours. Thyroid Function Tests: No results for input(s): TSH, T4TOTAL, FREET4, T3FREE, THYROIDAB in the last 72 hours. Anemia Panel: No results for input(s): VITAMINB12, FOLATE, FERRITIN, TIBC, IRON, RETICCTPCT in the last 72 hours. Sepsis Labs: No results for input(s): PROCALCITON, LATICACIDVEN in the last 168 hours.  Recent Results (from the past 240 hour(s))  Urine Culture     Status: None (Preliminary result)   Collection Time: 07/17/21  3:40 PM   Specimen: Urine, Catheterized  Result Value Ref Range Status   Specimen Description   Final    URINE, CATHETERIZED Performed at [facility], [address] [place], [place] [address]    Special Requests   Final    STERILE CONTAINER Performed at [facility], [address] [place], [place] [address]    Culture PENDING  Incomplete   Report Status PENDING  Incomplete  Resp Panel by RT-PCR (Flu A&B, Covid) Nasopharyngeal Swab     Status: None   Collection Time: 07/17/21  4:05 PM   Specimen: Nasopharyngeal Swab; Nasopharyngeal(NP) swabs in vial transport medium  Result Value Ref Range Status   SARS Coronavirus 2 by RT PCR NEGATIVE NEGATIVE Final    Comment: (NOTE) SARS-CoV-2 target nucleic acids are NOT DETECTED.  The SARS-CoV-2 RNA is generally detectable in upper respiratory specimens during the acute phase of infection. The lowest concentration of SARS-CoV-2 viral copies this assay can detect is 138 copies/mL. A negative result does not preclude SARS-Cov-2 infection and should not be used as the sole basis for treatment or other patient management decisions. A negative result may occur with  improper specimen collection/handling, submission of specimen other than nasopharyngeal swab, presence of viral  mutation(s) within the areas targeted by this assay, and inadequate number of viral copies(<138 copies/mL). A negative result must be combined with clinical observations, patient history, and epidemiological information. The expected result is Negative.  Fact Sheet for Patients:  BloggerCourse.comhttps://www.fda.gov/media/152166/download  Fact Sheet for Healthcare Providers:  SeriousBroker.ithttps://www.fda.gov/media/152162/download  This test is no  t yet approved or cleared by the [place] and  has been authorized for detection and/or diagnosis of SARS-CoV-2 by FDA under an Emergency Use Authorization (EUA). This EUA will remain  in effect (meaning this test can be used) for the duration of the COVID-19 declaration under Section 564(b)(1) of the Act, 21 U.S.C.section 360bbb-3(b)(1), unless the authorization is terminated  or revoked sooner.       Influenza A by PCR NEGATIVE NEGATIVE Final   Influenza B by PCR NEGATIVE NEGATIVE Final    Comment: (NOTE) The Xpert Xpress SARS-CoV-2/FLU/RSV plus assay is intended as an aid in the diagnosis of influenza from Nasopharyngeal swab specimens and should not be used as a sole basis for treatment. Nasal washings and aspirates are unacceptable for Xpert Xpress SARS-CoV-2/FLU/RSV testing.  Fact Sheet for Patients: BloggerCourse.com  Fact Sheet for Healthcare Providers: SeriousBroker.it  This test is not yet approved or cleared by the [place] FDA and has been authorized for detection and/or diagnosis of SARS-CoV-2 by FDA under an Emergency Use Authorization (EUA). This EUA will remain in effect (meaning this test can be used) for the duration of the COVID-19 declaration under Section 564(b)(1) of the Act, 21 U.S.C. section 360bbb-3(b)(1), unless the authorization is terminated or revoked.  Performed at [facility], [address] [place], [place] [address]      Radiology Studies: CT  Abdomen Pelvis Wo Contrast  Result Date: 07/17/2021 CLINICAL DATA:  Abdominal distension, constipation. EXAM: CT ABDOMEN AND PELVIS WITHOUT CONTRAST TECHNIQUE: Multidetector CT imaging of the abdomen and pelvis was performed following the standard protocol without IV contrast. COMPARISON:  None. FINDINGS: Lower chest: No acute abnormality. Hepatobiliary: Cholelithiasis is noted. No biliary dilatation is noted. The liver is unremarkable. Pancreas: Unremarkable. No pancreatic ductal dilatation or surrounding inflammatory changes. Spleen: Normal in size without focal abnormality. Adrenals/Urinary Tract: Adrenal glands appear normal. Mild bilateral hydroureteronephrosis is noted. Severe urinary bladder distention is noted concerning for bladder outlet obstruction. Stomach/Bowel: The stomach appears normal. No small bowel dilatation is noted. Large amount of stool seen throughout the colon. The appendix appears normal. Large amount of stool is noted in the rectum concerning for rectal impaction. Vascular/Lymphatic: Aortic atherosclerosis. No enlarged abdominal or pelvic lymph nodes. Reproductive: Mild prostatic enlargement is noted. Other: No abdominal wall hernia or abnormality. No abdominopelvic ascites. Musculoskeletal: No acute or significant osseous findings. IMPRESSION: Mild bilateral hydroureteronephrosis is noted secondary to severe urinary bladder distension, which in turn most likely is due to bladder outlet obstruction, potentially due to enlarged prostate gland. Cholelithiasis. Large amount of stool seen in the rectum concerning for rectal impaction. Aortic Atherosclerosis (ICD10-I70.0). Electronically Signed   By: Lupita Raider M.D.   On: 07/17/2021 14:52   DG ABD ACUTE 2+V W 1V CHEST  Result Date: 07/17/2021 CLINICAL DATA:  Abdominal pain and constipation EXAM: DG ABDOMEN ACUTE WITH 1 VIEW CHEST COMPARISON:  11/19/2013 FINDINGS: Cardiac shadow is within normal limits. The lungs are well aerated  bilaterally. Vascular calcifications are seen. No acute bony abnormality is noted. Scattered large and small bowel gas is noted. Retained fecal material is noted in the right colon consistent with a mild degree of constipation. Heavy vascular calcifications are noted. No free air is noted. No obstructive changes are seen. IMPRESSION: Changes of mild constipation. Aortic Atherosclerosis (ICD10-I70.0). Electronically Signed   By: Alcide Clever M.D.   On: 07/17/2021 12:07     LOS: 1 day   Lanae Boast, MD [facility]  07/18/2021, 10:20 AM

## 2021-07-18 NOTE — Consult Note (Signed)
Urology Consult   Physician requesting consult: Dr Jonathon Bellows  Reason for consult: Urinary retention and hematuria  History of Present Illness: Dylan Park is a 70 y.o. patient is a 70 year old white male with numerous medical issues including history of CVA, alcoholic liver disease, diabetes, hypertension hyperlipidemia.  Also history of recent constipation requiring fecal impaction disimpaction.  Patient was seen in the emergency room due to issues with constipation.  Ultimately had CT scan which showed massively dilated/distended bladder and bilateral hydroureteronephrosis.  Foley catheter was placed with 3400 cc residual.  Patient subsequently developed gross hematuria after placement of the Foley catheter.  No clots in the catheter and has been draining satisfactorily.  Serum creatinine 2.57 on admission now slowly improving to 2.33.  Patient states he was voiding satisfactorily prior to recent emergency room visit.  He denies a history of voiding or storage urinary symptoms, hematuria, UTIs, STDs, urolithiasis, GU malignancy/trauma/surgery.  Past Medical History:  Diagnosis Date   Diabetes mellitus without complication (HCC)    Stroke [facility])     History reviewed. No pertinent surgical history.  Current Hospital Medications:  Home Meds:  No current facility-administered medications on file prior to encounter.   Current Outpatient Medications on File Prior to Encounter  Medication Sig Dispense Refill   metFORMIN (GLUCOPHAGE) 500 MG tablet Take 500 mg by mouth 2 (two) times daily.     rosuvastatin (CRESTOR) 40 MG tablet Take 40 mg by mouth daily.     furosemide (LASIX) 20 MG tablet Take by mouth. (Patient not taking: No sig reported)     lisinopril (ZESTRIL) 20 MG tablet Take 20 mg by mouth daily. (Patient not taking: No sig reported)     metoprolol (TOPROL-XL) 200 MG 24 hr tablet Take 200 mg by mouth daily. (Patient not taking: No sig reported)       Scheduled Meds:  Chlorhexidine  Gluconate Cloth  6 each Topical Daily   insulin aspart  0-15 Units Subcutaneous TID WC   insulin aspart  0-5 Units Subcutaneous QHS   metoprolol  200 mg Oral Daily   polyethylene glycol  17 g Oral Daily   rosuvastatin  40 mg Oral Daily   Continuous Infusions:  sodium chloride 75 mL/hr at 07/18/21 1441   cefTRIAXone (ROCEPHIN)  IV     PRN Meds:.acetaminophen **OR** acetaminophen, albuterol, bisacodyl, HYDROcodone-acetaminophen, ipratropium, magnesium citrate, morphine injection, ondansetron **OR** ondansetron (ZOFRAN) IV, senna-docusate, traZODone  Allergies:  Allergies  Allergen Reactions   Penicillins Rash    History reviewed. No pertinent family history.  Social History:  reports that he has quit smoking. His smoking use included cigarettes. He has never used smokeless tobacco. He reports that he does not drink alcohol and does not use drugs.  ROS: A complete review of systems was performed.  All systems are negative except for pertinent findings as noted.  Physical Exam:  Vital signs in last 24 hours: Temp:  [97.5 F (36.4 C)-98.7 F (37.1 C)] 97.5 F (36.4 C) (07/21 1321) Pulse Rate:  [71-107] 71 (07/21 1439) Resp:  [15-20] 19 (07/21 1321) BP: (95-125)/(48-81) 114/59 (07/21 1439) SpO2:  [95 %-100 %] 98 % (07/21 1321) Weight:  [74.3 kg] 74.3 kg (07/20 2205) Constitutional:  Alert and oriented, No acute distress, patient conversive, answers questions appropriatel Cardiovascular: Regular rate and rhythm, No JVD Respiratory: Normal respiratory effort, Lungs clear bilaterally GI: Abdomen is soft, nontender, nondistended, no abdominal masses GU: No CVA tenderness Lymphatic: No lymphadenopathy Neurologic: Patient with what appears to be left hemiparesis  and is bedbound. Psychiatric: Normal mood and affect  Laboratory Data:  Recent Labs    07/17/21 1124 07/18/21 0452 07/18/21 1356  WBC 13.4* 18.0*  --   HGB 13.1 13.9 11.6*  HCT 38.3* 42.5 34.6*  PLT 318 340  --      Recent Labs    07/17/21 1124 07/18/21 0452 07/18/21 0924  NA 130* 136 134*  K 4.7 5.6* 4.8  CL 94* 96* 100  GLUCOSE 127* 194* 171*  BUN 40* 42* 46*  CALCIUM 9.3 9.8 8.6*  CREATININE 2.57* 2.33* 2.35*     Results for orders placed or performed during the hospital encounter of 07/17/21 (from the past 24 hour(s))  Glucose, capillary     Status: Abnormal   Collection Time: 07/18/21 12:10 AM  Result Value Ref Range   Glucose-Capillary 175 (H) 70 - 99 mg/dL  HIV Antibody (routine testing w rflx)     Status: None   Collection Time: 07/18/21  4:52 AM  Result Value Ref Range   HIV Screen 4th Generation wRfx Non Reactive Non Reactive  Comprehensive metabolic panel     Status: Abnormal   Collection Time: 07/18/21  4:52 AM  Result Value Ref Range   Sodium 136 135 - 145 mmol/L   Potassium 5.6 (H) 3.5 - 5.1 mmol/L   Chloride 96 (L) 98 - 111 mmol/L   CO2 22 22 - 32 mmol/L   Glucose, Bld 194 (H) 70 - 99 mg/dL   BUN 42 (H) 8 - 23 mg/dL   Creatinine, Ser 1.612.33 (H) 0.61 - 1.24 mg/dL   Calcium 9.8 8.9 - 09.610.3 mg/dL   Total Protein 7.7 6.5 - 8.1 g/dL   Albumin 3.1 (L) 3.5 - 5.0 g/dL   AST 14 (L) 15 - 41 U/L   ALT 11 0 - 44 U/L   Alkaline Phosphatase 62 38 - 126 U/L   Total Bilirubin 0.8 0.3 - 1.2 mg/dL   GFR, Estimated 30 (L) >60 mL/min   Anion gap 18 (H) 5 - 15  CBC     Status: Abnormal   Collection Time: 07/18/21  4:52 AM  Result Value Ref Range   WBC 18.0 (H) 4.0 - 10.5 K/uL   RBC 4.60 4.22 - 5.81 MIL/uL   Hemoglobin 13.9 13.0 - 17.0 g/dL   HCT 04.542.5 40.939.0 - 81.152.0 %   MCV 92.4 80.0 - 100.0 fL   MCH 30.2 26.0 - 34.0 pg   MCHC 32.7 30.0 - 36.0 g/dL   RDW 91.412.9 78.211.5 - 95.615.5 %   Platelets 340 150 - 400 K/uL   nRBC 0.0 0.0 - 0.2 %  Hemoglobin A1c     Status: Abnormal   Collection Time: 07/18/21  4:52 AM  Result Value Ref Range   Hgb A1c MFr Bld 6.3 (H) 4.8 - 5.6 %   Mean Plasma Glucose 134.11 mg/dL  Glucose, capillary     Status: Abnormal   Collection Time: 07/18/21  7:23 AM   Result Value Ref Range   Glucose-Capillary 198 (H) 70 - 99 mg/dL  Basic metabolic panel     Status: Abnormal   Collection Time: 07/18/21  9:24 AM  Result Value Ref Range   Sodium 134 (L) 135 - 145 mmol/L   Potassium 4.8 3.5 - 5.1 mmol/L   Chloride 100 98 - 111 mmol/L   CO2 24 22 - 32 mmol/L   Glucose, Bld 171 (H) 70 - 99 mg/dL   BUN 46 (H) 8 - 23 mg/dL  Creatinine, Ser 2.35 (H) 0.61 - 1.24 mg/dL   Calcium 8.6 (L) 8.9 - 10.3 mg/dL   GFR, Estimated 29 (L) >60 mL/min   Anion gap 10 5 - 15  Glucose, capillary     Status: Abnormal   Collection Time: 07/18/21 12:09 PM  Result Value Ref Range   Glucose-Capillary 232 (H) 70 - 99 mg/dL  Hemoglobin and hematocrit, blood     Status: Abnormal   Collection Time: 07/18/21  1:56 PM  Result Value Ref Range   Hemoglobin 11.6 (L) 13.0 - 17.0 g/dL   HCT 16.1 (L) 09.6 - 04.5 %  Glucose, capillary     Status: Abnormal   Collection Time: 07/18/21  4:54 PM  Result Value Ref Range   Glucose-Capillary 203 (H) 70 - 99 mg/dL   Recent Results (from the past 240 hour(s))  Urine Culture     Status: None (Preliminary result)   Collection Time: 07/17/21  3:40 PM   Specimen: Urine, Catheterized  Result Value Ref Range Status   Specimen Description   Final    URINE, CATHETERIZED Performed at [facility], [address] [place], [place] [address]    Special Requests   Final    STERILE CONTAINER Performed at [facility], [address], [place], [place] [address]    Culture PENDING  Incomplete   Report Status PENDING  Incomplete  Resp Panel by RT-PCR (Flu A&B, Covid) Nasopharyngeal Swab     Status: None   Collection Time: 07/17/21  4:05 PM   Specimen: Nasopharyngeal Swab; Nasopharyngeal(NP) swabs in vial transport medium  Result Value Ref Range Status   SARS Coronavirus 2 by RT PCR NEGATIVE NEGATIVE Final    Comment: (NOTE) SARS-CoV-2 target nucleic acids are NOT DETECTED.  The SARS-CoV-2 RNA is generally detectable in upper  respiratory specimens during the acute phase of infection. The lowest concentration of SARS-CoV-2 viral copies this assay can detect is 138 copies/mL. A negative result does not preclude SARS-Cov-2 infection and should not be used as the sole basis for treatment or other patient management decisions. A negative result may occur with  improper specimen collection/handling, submission of specimen other than nasopharyngeal swab, presence of viral mutation(s) within the areas targeted by this assay, and inadequate number of viral copies(<138 copies/mL). A negative result must be combined with clinical observations, patient history, and epidemiological information. The expected result is Negative.  Fact Sheet for Patients:  BloggerCourse.com  Fact Sheet for Healthcare Providers:  SeriousBroker.it  This test is no t yet approved or cleared by the [place] FDA and  has been authorized for detection and/or diagnosis of SARS-CoV-2 by FDA under an Emergency Use Authorization (EUA). This EUA will remain  in effect (meaning this test can be used) for the duration of the COVID-19 declaration under Section 564(b)(1) of the Act, 21 U.S.C.section 360bbb-3(b)(1), unless the authorization is terminated  or revoked sooner.       Influenza A by PCR NEGATIVE NEGATIVE Final   Influenza B by PCR NEGATIVE NEGATIVE Final    Comment: (NOTE) The Xpert Xpress SARS-CoV-2/FLU/RSV plus assay is intended as an aid in the diagnosis of influenza from Nasopharyngeal swab specimens and should not be used as a sole basis for treatment. Nasal washings and aspirates are unacceptable for Xpert Xpress SARS-CoV-2/FLU/RSV testing.  Fact Sheet for Patients: BloggerCourse.com  Fact Sheet for Healthcare Providers: SeriousBroker.it  This test is not yet approved or cleared by the [place] FDA and has  been  authorized for detection and/or diagnosis of SARS-CoV-2 by FDA under an Emergency Use Authorization (EUA). This EUA will remain in effect (meaning this test can be used) for the duration of the COVID-19 declaration under Section 564(b)(1) of the Act, 21 U.S.C. section 360bbb-3(b)(1), unless the authorization is terminated or revoked.  Performed at [facility], [address] [place], [place] [address]     Renal Function: Recent Labs    07/17/21 1124 07/18/21 0452 07/18/21 0924  CREATININE 2.57* 2.33* 2.35*   Estimated Creatinine Clearance: 31.2 mL/min (A) (by C-G formula based on SCr of 2.35 mg/dL (H)).  Radiologic Imaging: CT Abdomen Pelvis Wo Contrast  Result Date: 07/17/2021 CLINICAL DATA:  Abdominal distension, constipation. EXAM: CT ABDOMEN AND PELVIS WITHOUT CONTRAST TECHNIQUE: Multidetector CT imaging of the abdomen and pelvis was performed following the standard protocol without IV contrast. COMPARISON:  None. FINDINGS: Lower chest: No acute abnormality. Hepatobiliary: Cholelithiasis is noted. No biliary dilatation is noted. The liver is unremarkable. Pancreas: Unremarkable. No pancreatic ductal dilatation or surrounding inflammatory changes. Spleen: Normal in size without focal abnormality. Adrenals/Urinary Tract: Adrenal glands appear normal. Mild bilateral hydroureteronephrosis is noted. Severe urinary bladder distention is noted concerning for bladder outlet obstruction. Stomach/Bowel: The stomach appears normal. No small bowel dilatation is noted. Large amount of stool seen throughout the colon. The appendix appears normal. Large amount of stool is noted in the rectum concerning for rectal impaction. Vascular/Lymphatic: Aortic atherosclerosis. No enlarged abdominal or pelvic lymph nodes. Reproductive: Mild prostatic enlargement is noted. Other: No abdominal wall hernia or abnormality. No abdominopelvic ascites. Musculoskeletal: No acute or significant osseous  findings. IMPRESSION: Mild bilateral hydroureteronephrosis is noted secondary to severe urinary bladder distension, which in turn most likely is due to bladder outlet obstruction, potentially due to enlarged prostate gland. Cholelithiasis. Large amount of stool seen in the rectum concerning for rectal impaction. Aortic Atherosclerosis (ICD10-I70.0). Electronically Signed   By: Lupita Raider M.D.   On: 07/17/2021 14:52   DG ABD ACUTE 2+V W 1V CHEST  Result Date: 07/17/2021 CLINICAL DATA:  Abdominal pain and constipation EXAM: DG ABDOMEN ACUTE WITH 1 VIEW CHEST COMPARISON:  11/19/2013 FINDINGS: Cardiac shadow is within normal limits. The lungs are well aerated bilaterally. Vascular calcifications are seen. No acute bony abnormality is noted. Scattered large and small bowel gas is noted. Retained fecal material is noted in the right colon consistent with a mild degree of constipation. Heavy vascular calcifications are noted. No free air is noted. No obstructive changes are seen. IMPRESSION: Changes of mild constipation. Aortic Atherosclerosis (ICD10-I70.0). Electronically Signed   By: Alcide Clever M.D.   On: 07/17/2021 12:07    I independently reviewed the above imaging studies.  Impression/Recommendation Urinary retention with massively distended bladder and secondary obstructive uropathy with bilateral hydronephrosis secondary to lower urinary tract obstruction.  Questionable primary bladder failure versus BPH Plan/recommendation: Expect to have hematuria for several days with massive distention of the bladder.  Foley appears to be draining satisfactorily although dark in color no evidence of clots.  Will likely need to keep Foley for at least a month in the outpatient follow-up for probable cystoscopy and/or urodynamic evaluation.  If patient has development of clots may need CBI irrigation but currently does not need to have CBI.  Belva Agee 07/18/2021, 5:40 PM       CC:

## 2021-07-18 NOTE — Plan of Care (Signed)
  Problem: Activity: Goal: Risk for activity intolerance will decrease Outcome: Progressing   Problem: Nutrition: Goal: Adequate nutrition will be maintained Outcome: Progressing   Problem: Coping: Goal: Level of anxiety will decrease Outcome: Progressing   

## 2021-07-19 LAB — BASIC METABOLIC PANEL
Anion gap: 8 (ref 5–15)
BUN: 46 mg/dL — ABNORMAL HIGH (ref 8–23)
CO2: 23 mmol/L (ref 22–32)
Calcium: 8.7 mg/dL — ABNORMAL LOW (ref 8.9–10.3)
Chloride: 103 mmol/L (ref 98–111)
Creatinine, Ser: 2.11 mg/dL — ABNORMAL HIGH (ref 0.61–1.24)
GFR, Estimated: 33 mL/min — ABNORMAL LOW (ref >60)
Glucose, Bld: 143 mg/dL — ABNORMAL HIGH (ref 70–99)
Potassium: 4.4 mmol/L (ref 3.5–5.1)
Sodium: 134 mmol/L — ABNORMAL LOW (ref 135–145)

## 2021-07-19 LAB — HEMOGLOBIN AND HEMATOCRIT, BLOOD
HCT: 33.8 % — ABNORMAL LOW (ref 39.0–52.0)
HCT: 34.1 % — ABNORMAL LOW (ref 39.0–52.0)
Hemoglobin: 11.1 g/dL — ABNORMAL LOW (ref 13.0–17.0)
Hemoglobin: 11.3 g/dL — ABNORMAL LOW (ref 13.0–17.0)

## 2021-07-19 LAB — CBC
HCT: 33.5 % — ABNORMAL LOW (ref 39.0–52.0)
Hemoglobin: 11 g/dL — ABNORMAL LOW (ref 13.0–17.0)
MCH: 31 pg (ref 26.0–34.0)
MCHC: 32.8 g/dL (ref 30.0–36.0)
MCV: 94.4 fL (ref 80.0–100.0)
Platelets: 268 10*3/uL (ref 150–400)
RBC: 3.55 MIL/uL — ABNORMAL LOW (ref 4.22–5.81)
RDW: 13 % (ref 11.5–15.5)
WBC: 14.9 10*3/uL — ABNORMAL HIGH (ref 4.0–10.5)
nRBC: 0 % (ref 0.0–0.2)

## 2021-07-19 LAB — GLUCOSE, CAPILLARY
Glucose-Capillary: 146 mg/dL — ABNORMAL HIGH (ref 70–99)
Glucose-Capillary: 206 mg/dL — ABNORMAL HIGH (ref 70–99)
Glucose-Capillary: 187 mg/dL — ABNORMAL HIGH (ref 70–99)
Glucose-Capillary: 200 mg/dL — ABNORMAL HIGH (ref 70–99)

## 2021-07-19 MED ORDER — BISACODYL 10 MG RE SUPP
10.0000 mg | Freq: Every day | RECTAL | Status: DC | PRN
  Administered 2021-07-19: 10 mg via RECTAL
  Filled 2021-07-19: qty 1

## 2021-07-19 MED ORDER — COLLAGENASE 250 UNIT/GM EX OINT
TOPICAL_OINTMENT | Freq: Every day | CUTANEOUS | Status: DC
  Filled 2021-07-19 (×2): qty 30

## 2021-07-19 MED ORDER — ZINC OXIDE 40 % EX OINT
TOPICAL_OINTMENT | Freq: Once | CUTANEOUS | Status: AC
  Filled 2021-07-19: qty 57

## 2021-07-19 NOTE — Progress Notes (Signed)
  Subjective: Patient feeling better today.  Urine is clearing is now much lighter in color without clots but still dark almost cola colored.  Creatinine with slight improvement today, now 2.11 after Foley decompression.  Objective: Vital signs in last 24 hours: Temp:  [97.8 F (36.6 C)-98 F (36.7 C)] 97.9 F (36.6 C) (07/22 1352) Pulse Rate:  [61-63] 62 (07/22 1352) Resp:  [16-18] 18 (07/22 1352) BP: (103-118)/(60-63) 104/61 (07/22 1352) SpO2:  [97 %-99 %] 97 % (07/22 1352)  Intake/Output from previous day: 07/21 0701 - 07/22 0700 In: 2462.8 [P.O.:480; I.V.:1382.8; IV Piggyback:600] Out: 2000 [Urine:2000] Intake/Output this shift: No intake/output data recorded.  Physical Exam:  General: Alert and oriented  Lab Results: Recent Labs    07/18/21 2132 07/19/21 0428 07/19/21 0947  HGB 11.2* 11.0*  11.1* 11.3*  HCT 33.6* 33.5*  33.8* 34.1*   BMET Recent Labs    07/18/21 0924 07/19/21 0428  NA 134* 134*  K 4.8 4.4  CL 100 103  CO2 24 23  GLUCOSE 171* 143*  BUN 46* 46*  CREATININE 2.35* 2.11*  CALCIUM 8.6* 8.7*     Studies/Results: No results found.  Assessment/Plan: 1 acute urinary retention questionable BPH related with associated hydronephrosis and acute kidney injury with hematuria after Foley decompression-improving Plan/recommendation continue Foley will likely need to keep the Foley for at least a month and can get outpatient follow-up for probable urodynamics and/or cystoscopy.  Expect hematuria due to continued to clear with no intervention.  Urology available over the weekend if needed please call    LOS: 2 days   Belva Agee 07/19/2021, 4:18 PM

## 2021-07-19 NOTE — Progress Notes (Signed)
PT Cancellation Note  Patient Details Name: Dylan Park MRN: [id number] DOB: Nov 16, 1951   Cancelled Treatment:    Reason Eval/Treat Not Completed: PT screened, no needs identified, will sign off Pt declines therapy services in the hospital stating, "it would be a waste of time."  Pt reports being at [facility] previously however he states they let him become constipated so he left.  Pt from home and plans to return home upon d/c.  Pt again declined working with physical therapy during hospitalization.  PT to sign off.   Lorayne Getchell,KATHrine E 07/19/2021, 1:25 PM Paulino Door, DPT Acute Rehabilitation Services Pager: [phone] Office: [phone]

## 2021-07-19 NOTE — Progress Notes (Signed)
OT Cancellation Note  Patient Details Name: Dylan Park MRN: [id number] DOB: Mar 07, 1951   Cancelled Treatment:    Reason Eval/Treat Not Completed: Patient declined, no reason specified Pt adamantly declined OT services at acute level. Pt reports his only interest is getting his immediate medical issue resolved. Per chart, pt recently DC home from SNF rehab and has been bedbound. OT to sign off at this time. Please reconsult if needs change and/or pt more willing to participate in acute rehab services.   Lorre Munroe 07/19/2021, 2:06 PM

## 2021-07-19 NOTE — Consult Note (Signed)
WOC Nurse Consult Note: Reason for Consult: Right lateral malleolar Unstageable pressure injury. Identified by Wound Treatment Associate, D. Dark, who is seeking input on POC. Wound type:Pressure Pressure Injury POA: Yes Measurement: To be obtained byt Bedside RN and documented on Nursing Flow Sheet today prior to first dressing placement. Wound bed: 100% obscured by the presence of nonviable slough Drainage (amount, consistency, odor) small light yellow Periwound: intact with mild erythema Dressing procedure/placement/frequency: I have provided guidance for use of an enzymatic debriding agent, collagenase (Santyl) to be used once daily for 21 days.  This is to be topped with a saline moistened gauze dressing and secured.To prevent lateral rotation, a Prevalon Boot will be used and will also float the heel.  Turning and repositioning is in place and a sacral prophylactic foam dressing is in place.   WOC nursing team will not follow, but will remain available to this patient, the nursing and medical teams.  Please re-consult if needed. Thanks, Ladona Mow, MSN, RN, GNP, [facility]  Pager# [phone]

## 2021-07-19 NOTE — Plan of Care (Signed)
  Problem: Coping: Goal: Level of anxiety will decrease Outcome: Progressing   Problem: Elimination: Goal: Will not experience complications related to bowel motility Outcome: Progressing   Problem: Pain Managment: Goal: General experience of comfort will improve 07/19/2021 0354 by Mariana Kaufman, RN Outcome: Progressing 07/19/2021 0354 by Mariana Kaufman, RN Outcome: Progressing   Problem: Safety: Goal: Ability to remain free from injury will improve Outcome: Progressing   Problem: Skin Integrity: Goal: Risk for impaired skin integrity will decrease Outcome: Progressing   Problem: Education: Goal: Knowledge of General Education information will improve Description: Including pain rating scale, medication(s)/side effects and non-pharmacologic comfort measures Outcome: Progressing   Problem: Coping: Goal: Level of anxiety will decrease Outcome: Progressing

## 2021-07-19 NOTE — Progress Notes (Signed)
PROGRESS NOTE    Dylan Park  OZH:[id number] DOB: July 09, 1951 DOA: 07/17/2021 PCP: Elijio Miles., MD   Chief Complaint  Patient presents with   Abdominal Pain   Brief Narrative: 70 year old male with history of A. fib/stroke bedbound due to stroke x3 months, alcoholic liver disease, diabetes, hypertension, hyperlipidemia, history of CHF seen in the ED due to complaint of constipation abdominal pain for 1 days refractory to home. He was seen in the ED, found to have fecal impaction constipation, AKI hypokalemia, Foley cath was placed and he was tried and was admitted for further management. Patient is being treated for AKI obstructive uropathy, Foley was placed and having hematuria urology following  Subjective: Patient appears alert awake slow to respond appears at baseline has no new complaints. No fever overnight on room air Labs with improving creatinine, improving white counts  Assessment & Plan:  AKI 2/2 dehydration and obstructive uropathy Obstructive uropathy with massively distended bladder, bilateral hydronephrosis  Urine retention BPH Baseline creat 0.8 3/39/22, aki multifactorial.Lower urinary tract obstruction with BPH versus bladder failure. Reviewed CT imaging, Foley was placed in the ED and having gross hematuria without clots, draining adequately 2000 ml output. Urology following if he has clots will need CBI at the rise we will need to keep Foley may need at least for a month with outpatient urology follow-up.  Continue hydration monitor H&H. Bernerd Limbo is improving. Continue to hold his lisinopril and Lasix. Recent Labs  Lab 07/17/21 1124 07/18/21 0452 07/18/21 0924 07/19/21 0428  CREATININE 2.57* 2.33* 2.35* 2.11*   Gross hematuria,likely traumatic from Foley insertion in the setting of BPH: I have discontinued Lovenox, SCD. Urology on board.  If he is having clots may need CBI otherwise continue Foley catheter.  H&H slightly dropped, repeat CBC in a.m. continue plan  as per urology see above.   Recent Labs  Lab 07/17/21 1124 07/18/21 0452 07/18/21 1356 07/18/21 2132 07/19/21 0428  HGB 13.1 13.9 11.6* 11.2* 11.0*  11.1*  HCT 38.3* 42.5 34.6* 33.6* 33.5*  33.8*    UTI POA Leukocytosis Leukocytosis improving continue ceftriaxone follow-up culture. Recent Labs  Lab 07/17/21 1124 07/18/21 0452 07/19/21 0428  WBC 13.4* 18.0* 14.9*   Fecal impaction/constipation Enema has been tried. Cont rectal suppository daily as needed wound MiraLAX.  He had a bowel movement yesterday.Xray in am to look for stool burden   Hypokalemia-resolved  Hyperlipidemia cont  Crestor  History of A. fib cont Toprol  Elevated lipase- repeat in am.  CT abdomen no acute finding  History of a stroke/bedbound Not on antiplatelets or anticoagulation on statin.  Pressure injury POA:See below wound care consult. Diet Order             Diet heart healthy/carb modified Room service appropriate? Yes; Fluid consistency: Thin  Diet effective now                  Patient's Body mass index is 22.85 kg/m. caloric intake. Pressure Injury 07/17/21 Ankle Right;Lateral Unstageable - Full thickness tissue loss in which the base of the injury is covered by slough (yellow, tan, gray, green or brown) and/or eschar (tan, brown or black) in the wound bed. (Active)  07/17/21 2056  Location: Ankle  Location Orientation: Right;Lateral  Staging: Unstageable - Full thickness tissue loss in which the base of the injury is covered by slough (yellow, tan, gray, green or brown) and/or eschar (tan, brown or black) in the wound bed.  Wound Description (Comments):   Present on  Admission: Yes    DVT prophylaxis: SCDs Start: 07/17/21 2251 no chemical prophylaxis due to hematuria Code Status:   Code Status: Full Code  Family Communication: plan of care discussed with patient at bedside. Status is: Inpatient  Remains inpatient appropriate because:IV treatments appropriate due to  intensity of illness or inability to take PO and Inpatient level of care appropriate due to severity of illness  Dispo: The patient is from: Home              Anticipated d/c is to: SNF vs HH PTOT eval.              Patient currently is not medically stable to d/c.   Difficult to place patient No Unresulted Labs (From admission, onward)     Start     Ordered   07/19/21 0500  CBC  Daily,   R     Question:  Specimen collection method  Answer:  Lab=Lab collect   07/18/21 1029   07/19/21 0500  Basic metabolic panel  Daily,   R     Question:  Specimen collection method  Answer:  Lab=Lab collect   07/18/21 1029   07/18/21 2200  Hemoglobin and hematocrit, blood  Every 6 hours,   R (with TIMED occurrences)      07/18/21 1325           Medications reviewed:  Scheduled Meds:  Chlorhexidine Gluconate Cloth  6 each Topical Daily   insulin aspart  0-15 Units Subcutaneous TID WC   insulin aspart  0-5 Units Subcutaneous QHS   metoprolol  200 mg Oral Daily   polyethylene glycol  17 g Oral Daily   rosuvastatin  40 mg Oral Daily   Continuous Infusions:  sodium chloride 75 mL/hr at 07/18/21 1441   cefTRIAXone (ROCEPHIN)  IV 1 g (07/18/21 1903)   Consultants:see note  Procedures:see note Antimicrobials: Anti-infectives (From admission, onward)    Start     Dose/Rate Route Frequency Ordered Stop   07/18/21 1700  cefTRIAXone (ROCEPHIN) 1 g in sodium chloride 0.9 % 100 mL IVPB        1 g 200 mL/hr over 30 Minutes Intravenous Every 24 hours 07/17/21 2250     07/17/21 1715  cefTRIAXone (ROCEPHIN) 1 g in sodium chloride 0.9 % 100 mL IVPB        1 g 200 mL/hr over 30 Minutes Intravenous  Once 07/17/21 1711 07/17/21 1754      Culture/Microbiology    Component Value Date/Time   SDES  07/17/2021 1540    URINE, CATHETERIZED Performed at [facility] [address], [place], [place] [address]    [facility]  07/17/2021 1540    STERILE CONTAINER Performed at [facility], [address] [place], [place] [address]    CULT PENDING 07/17/2021 1540   REPTSTATUS PENDING 07/17/2021 1540    Other culture-see note  Objective: Vitals: Today's Vitals   07/18/21 1439 07/18/21 2135 07/19/21 0018 07/19/21 0540  BP: (!) 114/59 103/63  118/60  Pulse: 71 63  61  Resp:  18  16  Temp:  98 F (36.7 C)  97.8 F (36.6 C)  TempSrc:  Oral  Oral  SpO2:  97%  99%  Weight:      Height:      PainSc:   0-No pain     Intake/Output Summary (Last 24 hours) at 07/19/2021 0730 Last data filed at 07/19/2021 0600 Gross per 24 hour  Intake  2462.83 ml  Output 2000 ml  Net 462.83 ml    Filed Weights   07/17/21 1104 07/17/21 2205  Weight: 77.1 kg 74.3 kg   Weight change:   Intake/Output from previous day: 07/21 0701 - 07/22 0700 In: 2462.8 [P.O.:480; I.V.:1382.8; IV Piggyback:600] Out: 2000 [Urine:2000] Intake/Output this shift: No intake/output data recorded. Filed Weights   07/17/21 1104 07/17/21 2205  Weight: 77.1 kg 74.3 kg   Examination: General exam: AAOx3, older than the stated age, ill looking frail HEENT:Oral mucosa moist, Ear/Nose WNL grossly, dentition normal. Respiratory system: bilaterally clear without any added sounds, no use of accessory muscle Cardiovascular system: S1 & S2 +, No JVD,. Gastrointestinal system: Abdomen soft, Foley catheter in place urine reddish discoloration from hematuria  BS+ Nervous System:Alert, awake,  bedbound from stroke. Extremities: no edema, distal peripheral pulses palpable.  Skin: No rashes,no icterus. MSK: Normal muscle bulk,tone, power   Data Reviewed: I have personally reviewed following labs and imaging studies CBC: Recent Labs  Lab 07/17/21 1124 07/18/21 0452 07/18/21 1356 07/18/21 2132 07/19/21 0428  WBC 13.4* 18.0*  --   --  14.9*  NEUTROABS 10.2*  --   --   --   --   HGB 13.1 13.9 11.6* 11.2* 11.0*  11.1*  HCT 38.3* 42.5 34.6* 33.6* 33.5*  33.8*  MCV 91.0 92.4  --   --  94.4  PLT 318 340  --   --   268    Basic Metabolic Panel: Recent Labs  Lab 07/17/21 1124 07/18/21 0452 07/18/21 0924 07/19/21 0428  NA 130* 136 134* 134*  K 4.7 5.6* 4.8 4.4  CL 94* 96* 100 103  CO2 23 22 24 23   GLUCOSE 127* 194* 171* 143*  BUN 40* 42* 46* 46*  CREATININE 2.57* 2.33* 2.35* 2.11*  CALCIUM 9.3 9.8 8.6* 8.7*    GFR: Estimated Creatinine Clearance: 34.7 mL/min (A) (by C-G formula based on SCr of 2.11 mg/dL (H)). Liver Function Tests: Recent Labs  Lab 07/17/21 1124 07/18/21 0452  AST 17 14*  ALT 11 11  ALKPHOS 66 62  BILITOT 0.7 0.8  PROT 8.3* 7.7  ALBUMIN 3.3* 3.1*    Recent Labs  Lab 07/17/21 1124  LIPASE 102*    No results for input(s): AMMONIA in the last 168 hours. Coagulation Profile: No results for input(s): INR, PROTIME in the last 168 hours. Cardiac Enzymes: No results for input(s): CKTOTAL, CKMB, CKMBINDEX, TROPONINI in the last 168 hours. BNP (last 3 results) No results for input(s): PROBNP in the last 8760 hours. HbA1C: Recent Labs    07/18/21 0452  HGBA1C 6.3*    CBG: Recent Labs  Lab 07/18/21 0010 07/18/21 0723 07/18/21 1209 07/18/21 1654 07/18/21 2132  GLUCAP 175* 198* 232* 203* 161*    Lipid Profile: No results for input(s): CHOL, HDL, LDLCALC, TRIG, CHOLHDL, LDLDIRECT in the last 72 hours. Thyroid Function Tests: No results for input(s): TSH, T4TOTAL, FREET4, T3FREE, THYROIDAB in the last 72 hours. Anemia Panel: No results for input(s): VITAMINB12, FOLATE, FERRITIN, TIBC, IRON, RETICCTPCT in the last 72 hours. Sepsis Labs: No results for input(s): PROCALCITON, LATICACIDVEN in the last 168 hours.  Recent Results (from the past 240 hour(s))  Urine Culture     Status: None (Preliminary result)   Collection Time: 07/17/21  3:40 PM   Specimen: Urine, Catheterized  Result Value Ref Range Status   Specimen Description   Final    URINE, CATHETERIZED Performed at [facility], [address]  [place], [place] [address]    Special  Requests   Final    STERILE CONTAINER Performed at [facility], [address] [place], [place] [address]    Culture PENDING  Incomplete   Report Status PENDING  Incomplete  Resp Panel by RT-PCR (Flu A&B, Covid) Nasopharyngeal Swab     Status: None   Collection Time: 07/17/21  4:05 PM   Specimen: Nasopharyngeal Swab; Nasopharyngeal(NP) swabs in vial transport medium  Result Value Ref Range Status   SARS Coronavirus 2 by RT PCR NEGATIVE NEGATIVE Final    Comment: (NOTE) SARS-CoV-2 target nucleic acids are NOT DETECTED.  The SARS-CoV-2 RNA is generally detectable in upper respiratory specimens during the acute phase of infection. The lowest concentration of SARS-CoV-2 viral copies this assay can detect is 138 copies/mL. A negative result does not preclude SARS-Cov-2 infection and should not be used as the sole basis for treatment or other patient management decisions. A negative result may occur with  improper specimen collection/handling, submission of specimen other than nasopharyngeal swab, presence of viral mutation(s) within the areas targeted by this assay, and inadequate number of viral copies(<138 copies/mL). A negative result must be combined with clinical observations, patient history, and epidemiological information. The expected result is Negative.  Fact Sheet for Patients:  BloggerCourse.comhttps://www.fda.gov/media/152166/download  Fact Sheet for Healthcare Providers:  SeriousBroker.ithttps://www.fda.gov/media/152162/download  This test is no t yet approved or cleared by the [place] FDA and  has been authorized for detection and/or diagnosis of SARS-CoV-2 by FDA under an Emergency Use Authorization (EUA). This EUA will remain  in effect (meaning this test can be used) for the duration of the COVID-19 declaration under Section 564(b)(1) of the Act, 21 U.S.C.section 360bbb-3(b)(1), unless the authorization is terminated  or revoked sooner.       Influenza A by PCR NEGATIVE NEGATIVE  Final   Influenza B by PCR NEGATIVE NEGATIVE Final    Comment: (NOTE) The Xpert Xpress SARS-CoV-2/FLU/RSV plus assay is intended as an aid in the diagnosis of influenza from Nasopharyngeal swab specimens and should not be used as a sole basis for treatment. Nasal washings and aspirates are unacceptable for Xpert Xpress SARS-CoV-2/FLU/RSV testing.  Fact Sheet for Patients: BloggerCourse.comhttps://www.fda.gov/media/152166/download  Fact Sheet for Healthcare Providers: SeriousBroker.ithttps://www.fda.gov/media/152162/download  This test is not yet approved or cleared by the [place] FDA and has been authorized for detection and/or diagnosis of SARS-CoV-2 by FDA under an Emergency Use Authorization (EUA). This EUA will remain in effect (meaning this test can be used) for the duration of the COVID-19 declaration under Section 564(b)(1) of the Act, 21 U.S.C. section 360bbb-3(b)(1), unless the authorization is terminated or revoked.  Performed at [facility], [address] [place], [place] [address]       Radiology Studies: CT Abdomen Pelvis Wo Contrast  Result Date: 07/17/2021 CLINICAL DATA:  Abdominal distension, constipation. EXAM: CT ABDOMEN AND PELVIS WITHOUT CONTRAST TECHNIQUE: Multidetector CT imaging of the abdomen and pelvis was performed following the standard protocol without IV contrast. COMPARISON:  None. FINDINGS: Lower chest: No acute abnormality. Hepatobiliary: Cholelithiasis is noted. No biliary dilatation is noted. The liver is unremarkable. Pancreas: Unremarkable. No pancreatic ductal dilatation or surrounding inflammatory changes. Spleen: Normal in size without focal abnormality. Adrenals/Urinary Tract: Adrenal glands appear normal. Mild bilateral hydroureteronephrosis is noted. Severe urinary bladder distention is noted concerning for bladder outlet obstruction. Stomach/Bowel: The stomach appears normal. No small bowel dilatation is noted. Large amount of stool seen throughout the  colon.  The appendix appears normal. Large amount of stool is noted in the rectum concerning for rectal impaction. Vascular/Lymphatic: Aortic atherosclerosis. No enlarged abdominal or pelvic lymph nodes. Reproductive: Mild prostatic enlargement is noted. Other: No abdominal wall hernia or abnormality. No abdominopelvic ascites. Musculoskeletal: No acute or significant osseous findings. IMPRESSION: Mild bilateral hydroureteronephrosis is noted secondary to severe urinary bladder distension, which in turn most likely is due to bladder outlet obstruction, potentially due to enlarged prostate gland. Cholelithiasis. Large amount of stool seen in the rectum concerning for rectal impaction. Aortic Atherosclerosis (ICD10-I70.0). Electronically Signed   By: Lupita Raider M.D.   On: 07/17/2021 14:52   DG ABD ACUTE 2+V W 1V CHEST  Result Date: 07/17/2021 CLINICAL DATA:  Abdominal pain and constipation EXAM: DG ABDOMEN ACUTE WITH 1 VIEW CHEST COMPARISON:  11/19/2013 FINDINGS: Cardiac shadow is within normal limits. The lungs are well aerated bilaterally. Vascular calcifications are seen. No acute bony abnormality is noted. Scattered large and small bowel gas is noted. Retained fecal material is noted in the right colon consistent with a mild degree of constipation. Heavy vascular calcifications are noted. No free air is noted. No obstructive changes are seen. IMPRESSION: Changes of mild constipation. Aortic Atherosclerosis (ICD10-I70.0). Electronically Signed   By: Alcide Clever M.D.   On: 07/17/2021 12:07     LOS: 2 days   Lanae Boast, MD [facility]  07/19/2021, 7:30 AM

## 2021-07-20 ENCOUNTER — Inpatient Hospital Stay (HOSPITAL_COMMUNITY): Payer: Medicare HMO

## 2021-07-20 DIAGNOSIS — N390 Urinary tract infection, site not specified: Secondary | ICD-10-CM

## 2021-07-20 DIAGNOSIS — Z7401 Bed confinement status: Secondary | ICD-10-CM

## 2021-07-20 DIAGNOSIS — Z8673 Personal history of transient ischemic attack (TIA), and cerebral infarction without residual deficits: Secondary | ICD-10-CM

## 2021-07-20 DIAGNOSIS — N1330 Unspecified hydronephrosis: Secondary | ICD-10-CM

## 2021-07-20 LAB — LIPASE, BLOOD: Lipase: 115 U/L — ABNORMAL HIGH (ref 11–51)

## 2021-07-20 LAB — BASIC METABOLIC PANEL
Anion gap: 7 (ref 5–15)
BUN: 39 mg/dL — ABNORMAL HIGH (ref 8–23)
CO2: 25 mmol/L (ref 22–32)
Calcium: 8.5 mg/dL — ABNORMAL LOW (ref 8.9–10.3)
Chloride: 103 mmol/L (ref 98–111)
Creatinine, Ser: 1.63 mg/dL — ABNORMAL HIGH (ref 0.61–1.24)
GFR, Estimated: 45 mL/min — ABNORMAL LOW (ref >60)
Glucose, Bld: 189 mg/dL — ABNORMAL HIGH (ref 70–99)
Potassium: 3.7 mmol/L (ref 3.5–5.1)
Sodium: 135 mmol/L (ref 135–145)

## 2021-07-20 LAB — CBC
HCT: 31.5 % — ABNORMAL LOW (ref 39.0–52.0)
Hemoglobin: 10.3 g/dL — ABNORMAL LOW (ref 13.0–17.0)
MCH: 30.9 pg (ref 26.0–34.0)
MCHC: 32.7 g/dL (ref 30.0–36.0)
MCV: 94.6 fL (ref 80.0–100.0)
Platelets: 258 10*3/uL (ref 150–400)
RBC: 3.33 MIL/uL — ABNORMAL LOW (ref 4.22–5.81)
RDW: 13.1 % (ref 11.5–15.5)
WBC: 12.6 10*3/uL — ABNORMAL HIGH (ref 4.0–10.5)
nRBC: 0 % (ref 0.0–0.2)

## 2021-07-20 LAB — HEMOGLOBIN AND HEMATOCRIT, BLOOD
HCT: 36.3 % — ABNORMAL LOW (ref 39.0–52.0)
Hemoglobin: 11.8 g/dL — ABNORMAL LOW (ref 13.0–17.0)

## 2021-07-20 LAB — GLUCOSE, CAPILLARY
Glucose-Capillary: 179 mg/dL — ABNORMAL HIGH (ref 70–99)
Glucose-Capillary: 139 mg/dL — ABNORMAL HIGH (ref 70–99)
Glucose-Capillary: 152 mg/dL — ABNORMAL HIGH (ref 70–99)
Glucose-Capillary: 145 mg/dL — ABNORMAL HIGH (ref 70–99)

## 2021-07-20 MED ORDER — BISACODYL 10 MG RE SUPP
10.0000 mg | Freq: Every day | RECTAL | Status: DC
  Administered 2021-07-20 – 2021-07-23 (×4): 10 mg via RECTAL
  Filled 2021-07-20 (×4): qty 1

## 2021-07-20 MED ORDER — POLYETHYLENE GLYCOL 3350 17 G PO PACK
17.0000 g | PACK | Freq: Two times a day (BID) | ORAL | Status: DC
  Administered 2021-07-20 – 2021-07-23 (×7): 17 g via ORAL
  Filled 2021-07-20 (×7): qty 1

## 2021-07-20 MED ORDER — SENNOSIDES-DOCUSATE SODIUM 8.6-50 MG PO TABS
2.0000 | ORAL_TABLET | Freq: Two times a day (BID) | ORAL | Status: DC
  Administered 2021-07-20 – 2021-07-23 (×8): 2 via ORAL
  Filled 2021-07-20 (×8): qty 2

## 2021-07-20 NOTE — Progress Notes (Signed)
PROGRESS NOTE    Fender Herder  VHQ:[id number] DOB: [birth year] DOA: 07/17/2021 PCP: Elijio Miles., MD   Chief Complaint  Patient presents with   Abdominal Pain   Brief Narrative: 70 year old male with history of A. fib/stroke bedbound due to stroke x3 months, alcoholic liver disease, diabetes, hypertension, hyperlipidemia, history of CHF seen in the ED due to complaint of constipation and abdominal pain for >1wk refractory to home regimen. He was seen in the ED, found to have fecal impaction, constipation, AKI, hypokalemia,  Patient is being treated for AKI obstructive uropathy, Foley was placed and hematuria discovered. Urology following  Subjective: Continues to have constipation, slow to respond. Denies abdominal pain, nausea, vomiting.   Assessment & Plan:  AKI 2/2 volume depletion and obstructive uropathy  Urine retention 2/2 BPH c/b hydronephrosis  Baseline creat 0.8 3/39/22, AKI: prerenal + obstruction; improving Reviewed CT imaging, Foley was placed w/gross hematuria without clots, drained 2000 ml output.  - Urology following, will f/u outpatient - Maintain Foley, may need at least for a month  - Continue hydration monitor H&H.  - Continue to hold his lisinopril and Lasix.  Recent Labs  Lab 07/17/21 1124 07/18/21 0452 07/18/21 0924 07/19/21 0428 07/20/21 0426  CREATININE 2.57* 2.33* 2.35* 2.11* 1.63*   Gross hematuria Suspected bladder stretch injury Hematuria likely traumatic from Foley insertion in the setting of BPH:  - Urology following - trend Hgb q12h Recent Labs  Lab 07/18/21 1356 07/18/21 2132 07/19/21 0428 07/19/21 0947 07/20/21 0426  HGB 11.6* 11.2* 11.0*  11.1* 11.3* 10.3*  HCT 34.6* 33.6* 33.5*  33.8* 34.1* 31.5*     Fecal impaction/constipation Given patient immobility, suspect this has likely contributed to significant constipation and therefore increased risk for UTI. - KUB c/w impaction and severe stool burden - Aggressive bowel  regimen: BID miralax, daily suppository, prn Mg citrate - if no improvement in AM, will give enema  UTI POA Leukocytosis improving. Urine Cx with E. faecalis - continue ceftriaxone; follow-up sensitivities  Recent Labs  Lab 07/17/21 1124 07/18/21 0452 07/19/21 0428 07/20/21 0426  WBC 13.4* 18.0* 14.9* 12.6*    Hyperlipidemia cont  Crestor History of A. fib cont Toprol Elevated lipase - 102->115 - unclear significance given lack of abdominal pain and CT abdomen no acute finding  History of a stroke/bedbound Not on antiplatelets or anticoagulation on statin.  Pressure injury POA:See below wound care consult. Diet Order             Diet heart healthy/carb modified Room service appropriate? Yes; Fluid consistency: Thin  Diet effective now                  Patient's Body mass index is 22.85 kg/m. caloric intake. Pressure Injury 07/17/21 Ankle Right;Lateral Unstageable - Full thickness tissue loss in which the base of the injury is covered by slough (yellow, tan, gray, green or brown) and/or eschar (tan, brown or black) in the wound bed. (Active)  07/17/21 2056  Location: Ankle  Location Orientation: Right;Lateral  Staging: Unstageable - Full thickness tissue loss in which the base of the injury is covered by slough (yellow, tan, gray, green or brown) and/or eschar (tan, brown or black) in the wound bed.  Wound Description (Comments):   Present on Admission: Yes    DVT prophylaxis: SCDs Start: 07/17/21 2251 no chemical prophylaxis due to hematuria Code Status:   Code Status: Full Code  Family Communication: plan of care discussed with patient at bedside. Status is: Inpatient  Remains inpatient appropriate because:IV treatments appropriate due to intensity of illness or inability to take PO and Inpatient level of care appropriate due to severity of illness  Dispo: The patient is from: Home              Anticipated d/c is to: SNF vs HH PTOT eval.              Patient  currently is not medically stable to d/c.   Difficult to place patient No Unresulted Labs (From admission, onward)     Start     Ordered   07/19/21 0500  CBC  Daily,   R     Question:  Specimen collection method  Answer:  Lab=Lab collect   07/18/21 1029   07/19/21 0500  Basic metabolic panel  Daily,   R     Question:  Specimen collection method  Answer:  Lab=Lab collect   07/18/21 1029           Medications reviewed:  Scheduled Meds:  bisacodyl  10 mg Rectal Daily   Chlorhexidine Gluconate Cloth  6 each Topical Daily   collagenase   Topical Daily   insulin aspart  0-15 Units Subcutaneous TID WC   insulin aspart  0-5 Units Subcutaneous QHS   metoprolol  200 mg Oral Daily   polyethylene glycol  17 g Oral BID   rosuvastatin  40 mg Oral Daily   senna-docusate  2 tablet Oral BID   Continuous Infusions:  sodium chloride 75 mL/hr at 07/20/21 0616   cefTRIAXone (ROCEPHIN)  IV 1 g (07/19/21 1626)   Consultants:see note  Procedures:see note Antimicrobials: Anti-infectives (From admission, onward)    Start     Dose/Rate Route Frequency Ordered Stop   07/18/21 1700  cefTRIAXone (ROCEPHIN) 1 g in sodium chloride 0.9 % 100 mL IVPB        1 g 200 mL/hr over 30 Minutes Intravenous Every 24 hours 07/17/21 2250     07/17/21 1715  cefTRIAXone (ROCEPHIN) 1 g in sodium chloride 0.9 % 100 mL IVPB        1 g 200 mL/hr over 30 Minutes Intravenous  Once 07/17/21 1711 07/17/21 1754      Culture/Microbiology    Component Value Date/Time   SDES  07/17/2021 1540    URINE, CATHETERIZED Performed at [facility], [address] [place], [place] [address]    SPECREQUEST STERILE CONTAINER 07/17/2021 1540   CULT (A) 07/17/2021 1540    >=100,000 COLONIES/mL ENTEROCOCCUS FAECALIS SUSCEPTIBILITIES TO FOLLOW Performed at [facility], [address], [place], [place] [address]    REPTSTATUS PENDING 07/17/2021 1540    Other culture-see note  Objective: Vitals: Today's  Vitals   07/19/21 2321 07/20/21 0439 07/20/21 0745 07/20/21 1359  BP:  (!) 105/54  108/61  Pulse:  62  63  Resp:  20  16  Temp:  98.2 F (36.8 C)  98.1 F (36.7 C)  TempSrc:  Oral  Oral  SpO2:  97%  100%  Weight:      Height:      PainSc: 0-No pain  0-No pain     Intake/Output Summary (Last 24 hours) at 07/20/2021 1542 Last data filed at 07/20/2021 1300 Gross per 24 hour  Intake 2262.52 ml  Output 1825 ml  Net 437.52 ml    Filed Weights   07/17/21 1104 07/17/21 2205  Weight: 77.1 kg 74.3 kg   Weight change:   Intake/Output from previous  day: 07/22 0701 - 07/23 0700 In: 1872.5 [I.V.:1772.5; IV Piggyback:100] Out: 1825 [Urine:1825] Intake/Output this shift: Total I/O In: 390 [P.O.:390] Out: -  Filed Weights   07/17/21 1104 07/17/21 2205  Weight: 77.1 kg 74.3 kg   Examination: General exam: AAOx3, older than the stated age, ill looking frail HEENT:Oral mucosa moist, Ear/Nose WNL grossly, dentition normal. Respiratory system: bilaterally clear without any added sounds, no use of accessory muscle Cardiovascular system: S1 & S2 +, No JVD,. Gastrointestinal system: Abdomen soft, Foley catheter in place urine reddish discoloration from hematuria  BS+ Nervous System:Alert, awake,  bedbound from stroke. Extremities: no edema, distal peripheral pulses palpable.  Skin: No rashes,no icterus. MSK: Normal muscle bulk,tone, power   Data Reviewed: I have personally reviewed following labs and imaging studies CBC: Recent Labs  Lab 07/17/21 1124 07/18/21 0452 07/18/21 1356 07/18/21 2132 07/19/21 0428 07/19/21 0947 07/20/21 0426  WBC 13.4* 18.0*  --   --  14.9*  --  12.6*  NEUTROABS 10.2*  --   --   --   --   --   --   HGB 13.1 13.9 11.6* 11.2* 11.0*  11.1* 11.3* 10.3*  HCT 38.3* 42.5 34.6* 33.6* 33.5*  33.8* 34.1* 31.5*  MCV 91.0 92.4  --   --  94.4  --  94.6  PLT 318 340  --   --  268  --  258    Basic Metabolic Panel: Recent Labs  Lab 07/17/21 1124  07/18/21 0452 07/18/21 0924 07/19/21 0428 07/20/21 0426  NA 130* 136 134* 134* 135  K 4.7 5.6* 4.8 4.4 3.7  CL 94* 96* 100 103 103  CO2 23 22 24 23 25   GLUCOSE 127* 194* 171* 143* 189*  BUN 40* 42* 46* 46* 39*  CREATININE 2.57* 2.33* 2.35* 2.11* 1.63*  CALCIUM 9.3 9.8 8.6* 8.7* 8.5*    GFR: Estimated Creatinine Clearance: 44.9 mL/min (A) (by C-G formula based on SCr of 1.63 mg/dL (H)). Liver Function Tests: Recent Labs  Lab 07/17/21 1124 07/18/21 0452  AST 17 14*  ALT 11 11  ALKPHOS 66 62  BILITOT 0.7 0.8  PROT 8.3* 7.7  ALBUMIN 3.3* 3.1*    Recent Labs  Lab 07/17/21 1124 07/20/21 0426  LIPASE 102* 115*    No results for input(s): AMMONIA in the last 168 hours. Coagulation Profile: No results for input(s): INR, PROTIME in the last 168 hours. Cardiac Enzymes: No results for input(s): CKTOTAL, CKMB, CKMBINDEX, TROPONINI in the last 168 hours. BNP (last 3 results) No results for input(s): PROBNP in the last 8760 hours. HbA1C: Recent Labs    07/18/21 0452  HGBA1C 6.3*    CBG: Recent Labs  Lab 07/19/21 1203 07/19/21 1735 07/19/21 2145 07/20/21 0715 07/20/21 1134  GLUCAP 206* 187* 200* 179* 139*    Lipid Profile: No results for input(s): CHOL, HDL, LDLCALC, TRIG, CHOLHDL, LDLDIRECT in the last 72 hours. Thyroid Function Tests: No results for input(s): TSH, T4TOTAL, FREET4, T3FREE, THYROIDAB in the last 72 hours. Anemia Panel: No results for input(s): VITAMINB12, FOLATE, FERRITIN, TIBC, IRON, RETICCTPCT in the last 72 hours. Sepsis Labs: No results for input(s): PROCALCITON, LATICACIDVEN in the last 168 hours.  Recent Results (from the past 240 hour(s))  Urine Culture     Status: Abnormal (Preliminary result)   Collection Time: 07/17/21  3:40 PM   Specimen: Urine, Catheterized  Result Value Ref Range Status   Specimen Description   Final    URINE, CATHETERIZED Performed at [facility]  [address], [address] [employer] [place], [place] [address]     Special Requests STERILE CONTAINER  Final   Culture (A)  Final    >=100,000 COLONIES/mL ENTEROCOCCUS FAECALIS SUSCEPTIBILITIES TO FOLLOW Performed at [facility], [address] [place], [place] [address]    Report Status PENDING  Incomplete  Resp Panel by RT-PCR (Flu A&B, Covid) Nasopharyngeal Swab     Status: None   Collection Time: 07/17/21  4:05 PM   Specimen: Nasopharyngeal Swab; Nasopharyngeal(NP) swabs in vial transport medium  Result Value Ref Range Status   SARS Coronavirus 2 by RT PCR NEGATIVE NEGATIVE Final    Comment: (NOTE) SARS-CoV-2 target nucleic acids are NOT DETECTED.  The SARS-CoV-2 RNA is generally detectable in upper respiratory specimens during the acute phase of infection. The lowest concentration of SARS-CoV-2 viral copies this assay can detect is 138 copies/mL. A negative result does not preclude SARS-Cov-2 infection and should not be used as the sole basis for treatment or other patient management decisions. A negative result may occur with  improper specimen collection/handling, submission of specimen other than nasopharyngeal swab, presence of viral mutation(s) within the areas targeted by this assay, and inadequate number of viral copies(<138 copies/mL). A negative result must be combined with clinical observations, patient history, and epidemiological information. The expected result is Negative.  Fact Sheet for Patients:  BloggerCourse.com  Fact Sheet for Healthcare Providers:  SeriousBroker.it  This test is no t yet approved or cleared by the [place] FDA and  has been authorized for detection and/or diagnosis of SARS-CoV-2 by FDA under an Emergency Use Authorization (EUA). This EUA will remain  in effect (meaning this test can be used) for the duration of the COVID-19 declaration under Section 564(b)(1) of the Act, 21 U.S.C.section 360bbb-3(b)(1), unless the authorization is  terminated  or revoked sooner.       Influenza A by PCR NEGATIVE NEGATIVE Final   Influenza B by PCR NEGATIVE NEGATIVE Final    Comment: (NOTE) The Xpert Xpress SARS-CoV-2/FLU/RSV plus assay is intended as an aid in the diagnosis of influenza from Nasopharyngeal swab specimens and should not be used as a sole basis for treatment. Nasal washings and aspirates are unacceptable for Xpert Xpress SARS-CoV-2/FLU/RSV testing.  Fact Sheet for Patients: BloggerCourse.com  Fact Sheet for Healthcare Providers: SeriousBroker.it  This test is not yet approved or cleared by the [place] FDA and has been authorized for detection and/or diagnosis of SARS-CoV-2 by FDA under an Emergency Use Authorization (EUA). This EUA will remain in effect (meaning this test can be used) for the duration of the COVID-19 declaration under Section 564(b)(1) of the Act, 21 U.S.C. section 360bbb-3(b)(1), unless the authorization is terminated or revoked.  Performed at [facility], [address], [place], [place] [address]       Radiology Studies: DG Abd 1 View  Result Date: 07/20/2021 CLINICAL DATA:  Fecal impaction.  Abdominal distension. EXAM: ABDOMEN - 1 VIEW COMPARISON:  07/17/2021 FINDINGS: Prominent stool again noted right colon with increasing stool in the transverse colon and splenic flexure. Similar rectal stool burden. Nonspecific bowel gas pattern. Bones are diffusely demineralized. Arterial vascular calcification again noted. IMPRESSION: Slight increase in colonic stool volume with similar rectal stool burden. Electronically Signed   By: Kennith Center M.D.   On: 07/20/2021 08:16     LOS: 3 days   Rachael Fee, MD [facility]  07/20/2021, 3:42 PM

## 2021-07-20 NOTE — Plan of Care (Signed)
  Problem: Coping: Goal: Level of anxiety will decrease Outcome: Progressing   Problem: Safety: Goal: Ability to remain free from injury will improve Outcome: Progressing   Problem: Skin Integrity: Goal: Risk for impaired skin integrity will decrease Outcome: Progressing   Problem: Clinical Measurements: Goal: Will remain free from infection Outcome: Progressing

## 2021-07-21 LAB — CBC
HCT: 33.3 % — ABNORMAL LOW (ref 39.0–52.0)
Hemoglobin: 10.8 g/dL — ABNORMAL LOW (ref 13.0–17.0)
MCH: 30.8 pg (ref 26.0–34.0)
MCHC: 32.4 g/dL (ref 30.0–36.0)
MCV: 94.9 fL (ref 80.0–100.0)
Platelets: 245 10*3/uL (ref 150–400)
RBC: 3.51 MIL/uL — ABNORMAL LOW (ref 4.22–5.81)
RDW: 13.1 % (ref 11.5–15.5)
WBC: 10.5 10*3/uL (ref 4.0–10.5)
nRBC: 0 % (ref 0.0–0.2)

## 2021-07-21 LAB — URINE CULTURE: Culture: >=100000 — AB

## 2021-07-21 LAB — BASIC METABOLIC PANEL
Anion gap: 8 (ref 5–15)
BUN: 29 mg/dL — ABNORMAL HIGH (ref 8–23)
CO2: 28 mmol/L (ref 22–32)
Calcium: 9 mg/dL (ref 8.9–10.3)
Chloride: 105 mmol/L (ref 98–111)
Creatinine, Ser: 1.4 mg/dL — ABNORMAL HIGH (ref 0.61–1.24)
GFR, Estimated: 54 mL/min — ABNORMAL LOW (ref >60)
Glucose, Bld: 163 mg/dL — ABNORMAL HIGH (ref 70–99)
Potassium: 4.2 mmol/L (ref 3.5–5.1)
Sodium: 141 mmol/L (ref 135–145)

## 2021-07-21 LAB — GLUCOSE, CAPILLARY
Glucose-Capillary: 151 mg/dL — ABNORMAL HIGH (ref 70–99)
Glucose-Capillary: 187 mg/dL — ABNORMAL HIGH (ref 70–99)
Glucose-Capillary: 179 mg/dL — ABNORMAL HIGH (ref 70–99)
Glucose-Capillary: 178 mg/dL — ABNORMAL HIGH (ref 70–99)

## 2021-07-21 MED ORDER — LACTATED RINGERS IV SOLN: INTRAVENOUS | Status: AC

## 2021-07-21 MED ORDER — NITROFURANTOIN MONOHYD MACRO 100 MG PO CAPS
100.0000 mg | ORAL_CAPSULE | Freq: Two times a day (BID) | ORAL | Status: DC
  Administered 2021-07-21 – 2021-07-23 (×5): 100 mg via ORAL
  Filled 2021-07-21 (×5): qty 1

## 2021-07-21 MED ORDER — LACTATED RINGERS IV SOLN: INTRAVENOUS | Status: DC

## 2021-07-21 NOTE — Progress Notes (Addendum)
PROGRESS NOTE    Dylan Park  LYY:[id number] DOB: 1951/11/16 DOA: 07/17/2021 PCP: Elijio Miles., MD   Chief Complaint  Patient presents with   Abdominal Pain   Brief Narrative: 70 year old male with history of A. fib/stroke bedbound due to stroke x3 months, alcoholic liver disease, diabetes, hypertension, hyperlipidemia, history of CHF seen in the ED due to complaint of constipation and abdominal pain for >1wk refractory to home regimen. He was seen in the ED, found to have fecal impaction, constipation, AKI, hypokalemia,  Patient is being treated for AKI obstructive uropathy, Foley was placed and hematuria discovered. Urology following.  Subjective: Continues to have constipation, small bowel movement yesterday. Denies abdominal pain, nausea, vomiting.   Assessment & Plan:  AKI 2/2 volume depletion and obstructive uropathy  Urine retention 2/2 BPH c/b hydronephrosis  Baseline creat 0.8 3/39/22, AKI: prerenal + obstruction; improving Reviewed CT imaging, Foley was placed w/gross hematuria without clots, drained 2000 ml output.  - Urology following, will f/u outpatient - Maintain Foley, may need at least for a month  - Continue hydration, can potentially dc fluids 7/25 - Continue to hold his lisinopril and Lasix.  Recent Labs  Lab 07/18/21 0452 07/18/21 0924 07/19/21 0428 07/20/21 0426 07/21/21 0552  CREATININE 2.33* 2.35* 2.11* 1.63* 1.40*   Gross hematuria Suspected bladder stretch injury Hematuria likely traumatic from Foley insertion in the setting of BPH:  - Urology following - trend CBC daily, blood count has been stable Recent Labs  Lab 07/19/21 0428 07/19/21 0947 07/20/21 0426 07/20/21 1647 07/21/21 0552  HGB 11.0*  11.1* 11.3* 10.3* 11.8* 10.8*  HCT 33.5*  33.8* 34.1* 31.5* 36.3* 33.3*     Fecal impaction/constipation Given patient immobility, suspect this has likely contributed to significant constipation and therefore increased risk for UTI. -  KUB c/w impaction and severe stool burden - Aggressive bowel regimen: BID miralax, daily suppository, prn Mg citrate - will give tap water enema  UTI POA Enterococcus UTI Leukocytosis improving. Urine Cx with E. faecalis - started on ceftriaxone; follow-up sensitivities - transition to nitrofurantoin 100mg  BID  Recent Labs  Lab 07/17/21 1124 07/18/21 0452 07/19/21 0428 07/20/21 0426 07/21/21 0552  WBC 13.4* 18.0* 14.9* 12.6* 10.5    Hyperlipidemia cont  Crestor  History of A. Fib/A Tach cont Toprol. Resume anticoagulation  Elevated lipase - 102->115 - unclear significance given lack of abdominal pain and CT abdomen w/no acute finding - CHADSVASc elevated ~6 given hx of stroke, it was held for endoscopy/cscope per request from GI; need to clarify when to restart, f/u with Cardiology  History of recent stroke/bedbound Not on antiplatelets  - continue statin - consider anticoagulation  Pressure injury POA: - wound care consult. Diet Order             Diet heart healthy/carb modified Room service appropriate? Yes; Fluid consistency: Thin  Diet effective now                  Patient's Body mass index is 22.85 kg/m. caloric intake. Pressure Injury 07/17/21 Ankle Right;Lateral Unstageable - Full thickness tissue loss in which the base of the injury is covered by slough (yellow, tan, gray, green or brown) and/or eschar (tan, brown or black) in the wound bed. (Active)  07/17/21 2056  Location: Ankle  Location Orientation: Right;Lateral  Staging: Unstageable - Full thickness tissue loss in which the base of the injury is covered by slough (yellow, tan, gray, green or brown) and/or eschar (tan, brown or black) in  the wound bed.  Wound Description (Comments):   Present on Admission: Yes    DVT prophylaxis: SCDs Start: 07/17/21 2251 no chemical prophylaxis due to hematuria Code Status:   Code Status: Full Code  Family Communication: plan of care discussed with patient  at bedside. Status is: Inpatient  Remains inpatient appropriate because:IV treatments appropriate due to intensity of illness or inability to take PO and Inpatient level of care appropriate due to severity of illness  Dispo: The patient is from: Home              Anticipated d/c is to: SNF vs HH PTOT eval.              Patient currently is not medically stable to d/c.   Difficult to place patient No Unresulted Labs (From admission, onward)     Start     Ordered   07/20/21 1700  Hemoglobin and hematocrit, blood  Now then every 24 hours,   R (with TIMED occurrences)     Question:  Specimen collection method  Answer:  Lab=Lab collect   07/20/21 1634           Medications reviewed:  Scheduled Meds:  bisacodyl  10 mg Rectal Daily   Chlorhexidine Gluconate Cloth  6 each Topical Daily   collagenase   Topical Daily   insulin aspart  0-15 Units Subcutaneous TID WC   insulin aspart  0-5 Units Subcutaneous QHS   metoprolol  200 mg Oral Daily   polyethylene glycol  17 g Oral BID   rosuvastatin  40 mg Oral Daily   senna-docusate  2 tablet Oral BID   Continuous Infusions:  cefTRIAXone (ROCEPHIN)  IV 1 g (07/20/21 1745)   lactated ringers     Consultants:see note  Procedures:see note Antimicrobials: Anti-infectives (From admission, onward)    Start     Dose/Rate Route Frequency Ordered Stop   07/18/21 1700  cefTRIAXone (ROCEPHIN) 1 g in sodium chloride 0.9 % 100 mL IVPB        1 g 200 mL/hr over 30 Minutes Intravenous Every 24 hours 07/17/21 2250     07/17/21 1715  cefTRIAXone (ROCEPHIN) 1 g in sodium chloride 0.9 % 100 mL IVPB        1 g 200 mL/hr over 30 Minutes Intravenous  Once 07/17/21 1711 07/17/21 1754      Culture/Microbiology    Component Value Date/Time   SDES  07/17/2021 1540    URINE, CATHETERIZED Performed at [facility], [address] [place], [place] [address]    [facility]  07/17/2021 1540    STERILE CONTAINER Performed at [facility], [address] [place], [place] [address]    CULT >=100,000 COLONIES/mL ENTEROCOCCUS FAECALIS (A) 07/17/2021 1540   REPTSTATUS 07/21/2021 FINAL 07/17/2021 1540    Other culture-see note  Objective: Vitals: Today's Vitals   07/20/21 2130 07/21/21 0609 07/21/21 0956 07/21/21 1151  BP:  (!) 146/68 136/65 114/61  Pulse:  (!) 59 60 (!) 56  Resp:  16  16  Temp:  98.3 F (36.8 C)  97.6 F (36.4 C)  TempSrc:  Oral  Oral  SpO2:  99%  99%  Weight:      Height:      PainSc: 0-No pain       Intake/Output Summary (Last 24 hours) at 07/21/2021 1256 Last data filed at 07/21/2021 1207 Gross per 24 hour  Intake 390 ml  Output 3100 ml  Net -2710  ml    Filed Weights   07/17/21 1104 07/17/21 2205  Weight: 77.1 kg 74.3 kg   Weight change:   Intake/Output from previous day: 07/23 0701 - 07/24 0700 In: 630 [P.O.:630] Out: 1850 [Urine:1850] Intake/Output this shift: Total I/O In: -  Out: 1250 [Urine:1250] Filed Weights   07/17/21 1104 07/17/21 2205  Weight: 77.1 kg 74.3 kg   Examination: General exam: AAOx3, older than the stated age, ill looking frail HEENT:Oral mucosa moist, Ear/Nose WNL grossly, dentition normal. Respiratory system: bilaterally clear without any added sounds, no use of accessory muscle Cardiovascular system: S1 & S2 +, No JVD,. Gastrointestinal system: Abdomen soft, Foley catheter in place urine reddish discoloration from hematuria  BS+ Nervous System:Alert, awake,  bedbound from stroke. Extremities: no edema, distal peripheral pulses palpable.  Skin: No rashes,no icterus. MSK: Normal muscle bulk,tone, power   Data Reviewed: I have personally reviewed following labs and imaging studies CBC: Recent Labs  Lab 07/17/21 1124 07/18/21 0452 07/18/21 1356 07/19/21 0428 07/19/21 0947 07/20/21 0426 07/20/21 1647 07/21/21 0552  WBC 13.4* 18.0*  --  14.9*  --  12.6*  --  10.5  NEUTROABS 10.2*  --   --   --   --   --   --   --   HGB 13.1 13.9   < >  11.0*  11.1* 11.3* 10.3* 11.8* 10.8*  HCT 38.3* 42.5   < > 33.5*  33.8* 34.1* 31.5* 36.3* 33.3*  MCV 91.0 92.4  --  94.4  --  94.6  --  94.9  PLT 318 340  --  268  --  258  --  245   < > = values in this interval not displayed.    Basic Metabolic Panel: Recent Labs  Lab 07/18/21 0452 07/18/21 0924 07/19/21 0428 07/20/21 0426 07/21/21 0552  NA 136 134* 134* 135 141  K 5.6* 4.8 4.4 3.7 4.2  CL 96* 100 103 103 105  CO2 22 24 23 25 28   GLUCOSE 194* 171* 143* 189* 163*  BUN 42* 46* 46* 39* 29*  CREATININE 2.33* 2.35* 2.11* 1.63* 1.40*  CALCIUM 9.8 8.6* 8.7* 8.5* 9.0    GFR: Estimated Creatinine Clearance: 52.3 mL/min (A) (by C-G formula based on SCr of 1.4 mg/dL (H)). Liver Function Tests: Recent Labs  Lab 07/17/21 1124 07/18/21 0452  AST 17 14*  ALT 11 11  ALKPHOS 66 62  BILITOT 0.7 0.8  PROT 8.3* 7.7  ALBUMIN 3.3* 3.1*    Recent Labs  Lab 07/17/21 1124 07/20/21 0426  LIPASE 102* 115*    No results for input(s): AMMONIA in the last 168 hours. Coagulation Profile: No results for input(s): INR, PROTIME in the last 168 hours. Cardiac Enzymes: No results for input(s): CKTOTAL, CKMB, CKMBINDEX, TROPONINI in the last 168 hours. BNP (last 3 results) No results for input(s): PROBNP in the last 8760 hours. HbA1C: No results for input(s): HGBA1C in the last 72 hours.  CBG: Recent Labs  Lab 07/20/21 1134 07/20/21 1712 07/20/21 2237 07/21/21 0719 07/21/21 1149  GLUCAP 139* 152* 145* 151* 187*    Lipid Profile: No results for input(s): CHOL, HDL, LDLCALC, TRIG, CHOLHDL, LDLDIRECT in the last 72 hours. Thyroid Function Tests: No results for input(s): TSH, T4TOTAL, FREET4, T3FREE, THYROIDAB in the last 72 hours. Anemia Panel: No results for input(s): VITAMINB12, FOLATE, FERRITIN, TIBC, IRON, RETICCTPCT in the last 72 hours. Sepsis Labs: No results for input(s): PROCALCITON, LATICACIDVEN in the last 168 hours.  Recent Results (from  the past 240 hour(s))   Urine Culture     Status: Abnormal   Collection Time: 07/17/21  3:40 PM   Specimen: Urine, Catheterized  Result Value Ref Range Status   Specimen Description   Final    URINE, CATHETERIZED Performed at [facility], [address] [place], [place] [address]    Special Requests   Final    STERILE CONTAINER Performed at [facility], [address], [place], [place] [address]    Culture >=100,000 COLONIES/mL ENTEROCOCCUS FAECALIS (A)  Final   Report Status 07/21/2021 FINAL  Final   Organism ID, Bacteria ENTEROCOCCUS FAECALIS (A)  Final      Susceptibility   Enterococcus faecalis - MIC*    AMPICILLIN <=2 SENSITIVE Sensitive     NITROFURANTOIN <=16 SENSITIVE Sensitive     VANCOMYCIN 1 SENSITIVE Sensitive     * >=100,000 COLONIES/mL ENTEROCOCCUS FAECALIS  Resp Panel by RT-PCR (Flu A&B, Covid) Nasopharyngeal Swab     Status: None   Collection Time: 07/17/21  4:05 PM   Specimen: Nasopharyngeal Swab; Nasopharyngeal(NP) swabs in vial transport medium  Result Value Ref Range Status   SARS Coronavirus 2 by RT PCR NEGATIVE NEGATIVE Final    Comment: (NOTE) SARS-CoV-2 target nucleic acids are NOT DETECTED.  The SARS-CoV-2 RNA is generally detectable in upper respiratory specimens during the acute phase of infection. The lowest concentration of SARS-CoV-2 viral copies this assay can detect is 138 copies/mL. A negative result does not preclude SARS-Cov-2 infection and should not be used as the sole basis for treatment or other patient management decisions. A negative result may occur with  improper specimen collection/handling, submission of specimen other than nasopharyngeal swab, presence of viral mutation(s) within the areas targeted by this assay, and inadequate number of viral copies(<138 copies/mL). A negative result must be combined with clinical observations, patient history, and epidemiological information. The expected result is Negative.  Fact Sheet for  Patients:  BloggerCourse.com  Fact Sheet for Healthcare Providers:  SeriousBroker.it  This test is no t yet approved or cleared by the [place] FDA and  has been authorized for detection and/or diagnosis of SARS-CoV-2 by FDA under an Emergency Use Authorization (EUA). This EUA will remain  in effect (meaning this test can be used) for the duration of the COVID-19 declaration under Section 564(b)(1) of the Act, 21 U.S.C.section 360bbb-3(b)(1), unless the authorization is terminated  or revoked sooner.       Influenza A by PCR NEGATIVE NEGATIVE Final   Influenza B by PCR NEGATIVE NEGATIVE Final    Comment: (NOTE) The Xpert Xpress SARS-CoV-2/FLU/RSV plus assay is intended as an aid in the diagnosis of influenza from Nasopharyngeal swab specimens and should not be used as a sole basis for treatment. Nasal washings and aspirates are unacceptable for Xpert Xpress SARS-CoV-2/FLU/RSV testing.  Fact Sheet for Patients: BloggerCourse.com  Fact Sheet for Healthcare Providers: SeriousBroker.it  This test is not yet approved or cleared by the [place] FDA and has been authorized for detection and/or diagnosis of SARS-CoV-2 by FDA under an Emergency Use Authorization (EUA). This EUA will remain in effect (meaning this test can be used) for the duration of the COVID-19 declaration under Section 564(b)(1) of the Act, 21 U.S.C. section 360bbb-3(b)(1), unless the authorization is terminated or revoked.  Performed at [facility], [address] [place], [place] [address]       Radiology Studies: DG Abd 1 View  Result Date: 07/20/2021  CLINICAL DATA:  Fecal impaction.  Abdominal distension. EXAM: ABDOMEN - 1 VIEW COMPARISON:  07/17/2021 FINDINGS: Prominent stool again noted right colon with increasing stool in the transverse colon and splenic flexure. Similar rectal  stool burden. Nonspecific bowel gas pattern. Bones are diffusely demineralized. Arterial vascular calcification again noted. IMPRESSION: Slight increase in colonic stool volume with similar rectal stool burden. Electronically Signed   By: Kennith Center M.D.   On: 07/20/2021 08:16     LOS: 4 days   Rachael Fee, MD MPH [facility]  07/21/2021, 12:56 PM

## 2021-07-22 ENCOUNTER — Inpatient Hospital Stay (HOSPITAL_COMMUNITY): Payer: Medicare HMO

## 2021-07-22 LAB — CBC
HCT: 34.2 % — ABNORMAL LOW (ref 39.0–52.0)
Hemoglobin: 11.1 g/dL — ABNORMAL LOW (ref 13.0–17.0)
MCH: 30.7 pg (ref 26.0–34.0)
MCHC: 32.5 g/dL (ref 30.0–36.0)
MCV: 94.7 fL (ref 80.0–100.0)
Platelets: 262 10*3/uL (ref 150–400)
RBC: 3.61 MIL/uL — ABNORMAL LOW (ref 4.22–5.81)
RDW: 13 % (ref 11.5–15.5)
WBC: 13 10*3/uL — ABNORMAL HIGH (ref 4.0–10.5)
nRBC: 0 % (ref 0.0–0.2)

## 2021-07-22 LAB — BASIC METABOLIC PANEL
Anion gap: 6 (ref 5–15)
BUN: 23 mg/dL (ref 8–23)
CO2: 27 mmol/L (ref 22–32)
Calcium: 8.6 mg/dL — ABNORMAL LOW (ref 8.9–10.3)
Chloride: 102 mmol/L (ref 98–111)
Creatinine, Ser: 1.37 mg/dL — ABNORMAL HIGH (ref 0.61–1.24)
GFR, Estimated: 56 mL/min — ABNORMAL LOW (ref >60)
Glucose, Bld: 152 mg/dL — ABNORMAL HIGH (ref 70–99)
Potassium: 4 mmol/L (ref 3.5–5.1)
Sodium: 135 mmol/L (ref 135–145)

## 2021-07-22 LAB — GLUCOSE, CAPILLARY
Glucose-Capillary: 160 mg/dL — ABNORMAL HIGH (ref 70–99)
Glucose-Capillary: 258 mg/dL — ABNORMAL HIGH (ref 70–99)
Glucose-Capillary: 136 mg/dL — ABNORMAL HIGH (ref 70–99)
Glucose-Capillary: 225 mg/dL — ABNORMAL HIGH (ref 70–99)

## 2021-07-22 MED ORDER — FLEET ENEMA 7-19 GM/118ML RE ENEM
1.0000 | ENEMA | Freq: Once | RECTAL | Status: AC
  Administered 2021-07-22: 1 via RECTAL
  Filled 2021-07-22: qty 1

## 2021-07-22 NOTE — Progress Notes (Signed)
PROGRESS NOTE    Dylan MeiersJoseph Park  ZOX:[id number] DOB: Apr 07, 1951 DOA: 07/17/2021 PCP: Elijio MilesWeaver, John W., MD   Chief Complaint  Patient presents with   Abdominal Pain   Brief Narrative: 70 year old male with history of A. fib/stroke bedbound due to stroke x3 months, alcoholic liver disease, diabetes, hypertension, hyperlipidemia, history of CHF seen in the ED due to complaint of constipation and abdominal pain for >1wk refractory to home regimen. He was seen in the ED, found to have fecal impaction, constipation, AKI, hypokalemia,  Patient is being treated for AKI obstructive uropathy, Foley was placed and hematuria discovered. Urology following.   Assessment & Plan:  AKI 2/2 volume depletion and obstructive uropathy  Urine retention 2/2 BPH c/b hydronephrosis  Secondary to prerenal + obstruction; improving Reviewed CT imaging, Foley was placed w/gross hematuria without clots, drained 2000 ml output.  - Urology following and signed off, will f/u outpatient - Maintain Foley, may need at least for a month  - Continue hydration,  - Continue to hold his lisinopril and Lasix.  Recent Labs  Lab 07/18/21 0924 07/19/21 0428 07/20/21 0426 07/21/21 0552 07/22/21 0443  CREATININE 2.35* 2.11* 1.63* 1.40* 1.37*   Gross hematuria Suspected bladder stretch injury Hematuria likely traumatic from Foley insertion in the setting of BPH:  - Urology following.  Hematuria cleared. - trend CBC daily, blood count has been stable Recent Labs  Lab 07/19/21 0947 07/20/21 0426 07/20/21 1647 07/21/21 0552 07/22/21 0443  HGB 11.3* 10.3* 11.8* 10.8* 11.1*  HCT 34.1* 31.5* 36.3* 33.3* 34.2*     Fecal impaction/constipation Given patient immobility, suspect this has likely contributed to significant constipation and therefore increased risk for UTI. - KUB c/w impaction and severe stool burden, repeat today also shows significant stool burden.  He has had 2-3 bowel movements but they were very small  according to him. -Continue aggressive bowel regimen: BID miralax, daily suppository, prn Mg citrate.  We will try Fleet enema today.  UTI POA Enterococcus UTI Leukocytosis improving. Urine Cx with E. faecalis - started on ceftriaxone; follow-up sensitivities - transitioned to nitrofurantoin 100mg  BID  Recent Labs  Lab 07/18/21 0452 07/19/21 0428 07/20/21 0426 07/21/21 0552 07/22/21 0443  WBC 18.0* 14.9* 12.6* 10.5 13.0*    Hyperlipidemia cont  Crestor  History of A. Fib/A Tach cont Toprol. Resume anticoagulation  Elevated lipase - 102->115 - unclear significance given lack of abdominal pain and CT abdomen w/no acute finding - CHADSVASc elevated ~6 given hx of stroke, it was held for endoscopy/cscope per request from GI; need to clarify when to restart, f/u with Cardiology  History of recent stroke/bedbound Not on antiplatelets  - continue statin - consider anticoagulation  Pressure injury POA: - wound care consult. Diet Order             Diet heart healthy/carb modified Room service appropriate? Yes; Fluid consistency: Thin  Diet effective now                  Patient's Body mass index is 19.89 kg/m. caloric intake. Pressure Injury 07/17/21 Ankle Right;Lateral Unstageable - Full thickness tissue loss in which the base of the injury is covered by slough (yellow, tan, gray, green or brown) and/or eschar (tan, brown or black) in the wound bed. (Active)  07/17/21 2056  Location: Ankle  Location Orientation: Right;Lateral  Staging: Unstageable - Full thickness tissue loss in which the base of the injury is covered by slough (yellow, tan, gray, green or brown) and/or eschar (tan, brown  or black) in the wound bed.  Wound Description (Comments):   Present on Admission: Yes    DVT prophylaxis: SCDs Start: 07/17/21 2251 no chemical due to prophylaxis due to hematuria Code Status:   Code Status: Full Code  Family Communication: plan of care discussed with patient at  bedside. Status is: Inpatient  Remains inpatient appropriate because:IV treatments appropriate due to intensity of illness or inability to take PO and Inpatient level of care appropriate due to severity of illness  Dispo: The patient is from: Home              Anticipated d/c is to: SNF vs HH PTOT eval pending.              Patient currently is not medically stable to d/c.   Difficult to place patient No Unresulted Labs (From admission, onward)    None      Medications reviewed:  Scheduled Meds:  bisacodyl  10 mg Rectal Daily   Chlorhexidine Gluconate Cloth  6 each Topical Daily   collagenase   Topical Daily   insulin aspart  0-15 Units Subcutaneous TID WC   insulin aspart  0-5 Units Subcutaneous QHS   metoprolol  200 mg Oral Daily   nitrofurantoin (macrocrystal-monohydrate)  100 mg Oral Q12H   polyethylene glycol  17 g Oral BID   rosuvastatin  40 mg Oral Daily   senna-docusate  2 tablet Oral BID   Continuous Infusions:  lactated ringers 50 mL/hr at 07/22/21 0444   Consultants:see note  Procedures:see note Antimicrobials: Anti-infectives (From admission, onward)    Start     Dose/Rate Route Frequency Ordered Stop   07/21/21 1915  nitrofurantoin (macrocrystal-monohydrate) (MACROBID) capsule 100 mg        100 mg Oral Every 12 hours 07/21/21 1827 07/28/21 2159   07/18/21 1700  cefTRIAXone (ROCEPHIN) 1 g in sodium chloride 0.9 % 100 mL IVPB  Status:  Discontinued        1 g 200 mL/hr over 30 Minutes Intravenous Every 24 hours 07/17/21 2250 07/21/21 1827   07/17/21 1715  cefTRIAXone (ROCEPHIN) 1 g in sodium chloride 0.9 % 100 mL IVPB        1 g 200 mL/hr over 30 Minutes Intravenous  Once 07/17/21 1711 07/17/21 1754      Culture/Microbiology    Component Value Date/Time   SDES  07/17/2021 1540    URINE, CATHETERIZED Performed at [facility], [address], [place], [place] [address]    [facility]  07/17/2021 1540    STERILE CONTAINER Performed at [facility], [address], [place], [place] [address]    CULT >=100,000 COLONIES/mL ENTEROCOCCUS FAECALIS (A) 07/17/2021 1540   REPTSTATUS 07/21/2021 FINAL 07/17/2021 1540    Other culture-see note  Subjective: Seen and examined.  Fully alert and oriented.  Has had 2-3 partial small bowel movements but not a satisfying for bowel movement.  Abdominal x-ray still shows significant stool burden.  Patient does not feel comfortable going home yet.  Requesting 1 more day of hospitalization with current medication to help resolve his constipation.  Objective: Vitals: Today's Vitals   07/21/21 1423 07/21/21 1947 07/21/21 2020 07/22/21 0547  BP:  (!) 115/59  130/69  Pulse:  (!) 57  (!) 58  Resp:  16  18  Temp:  98.5 F (36.9 C)  97.9 F (36.6 C)  TempSrc:    Oral  SpO2:  99%  100%  Weight: 68.4 kg  Height: 6\' 1"  (1.854 m)     PainSc:   0-No pain     Intake/Output Summary (Last 24 hours) at 07/22/2021 0841 Last data filed at 07/22/2021 0500 Gross per 24 hour  Intake 892.36 ml  Output 1800 ml  Net -907.64 ml    Filed Weights   07/17/21 1104 07/17/21 2205 07/21/21 1423  Weight: 77.1 kg 74.3 kg 68.4 kg   Weight change:   Intake/Output from previous day: 07/24 0701 - 07/25 0700 In: 892.4 [P.O.:480; I.V.:412.4] Out: 1800 [Urine:1800] Intake/Output this shift: No intake/output data recorded. Filed Weights   07/17/21 1104 07/17/21 2205 07/21/21 1423  Weight: 77.1 kg 74.3 kg 68.4 kg   Examination: General exam: Appears calm and comfortable  Respiratory system: Clear to auscultation. Respiratory effort normal. Cardiovascular system: S1 & S2 heard, RRR. No JVD, murmurs, rubs, gallops or clicks. No pedal edema. Gastrointestinal system: Abdomen is nondistended, soft and nontender. No organomegaly or masses felt. Normal bowel sounds heard. Central nervous system: Alert and oriented. No focal neurological deficits. Extremities: Symmetric 5 x 5 power. Skin: No rashes, lesions or  ulcers.  Psychiatry: Judgement and insight appear normal. Mood & affect appropriate.    Data Reviewed: I have personally reviewed following labs and imaging studies CBC: Recent Labs  Lab 07/17/21 1124 07/18/21 0452 07/18/21 1356 07/19/21 0428 07/19/21 0947 07/20/21 0426 07/20/21 1647 07/21/21 0552 07/22/21 0443  WBC 13.4* 18.0*  --  14.9*  --  12.6*  --  10.5 13.0*  NEUTROABS 10.2*  --   --   --   --   --   --   --   --   HGB 13.1 13.9   < > 11.0*  11.1* 11.3* 10.3* 11.8* 10.8* 11.1*  HCT 38.3* 42.5   < > 33.5*  33.8* 34.1* 31.5* 36.3* 33.3* 34.2*  MCV 91.0 92.4  --  94.4  --  94.6  --  94.9 94.7  PLT 318 340  --  268  --  258  --  245 262   < > = values in this interval not displayed.    Basic Metabolic Panel: Recent Labs  Lab 07/18/21 0924 07/19/21 0428 07/20/21 0426 07/21/21 0552 07/22/21 0443  NA 134* 134* 135 141 135  K 4.8 4.4 3.7 4.2 4.0  CL 100 103 103 105 102  CO2 24 23 25 28 27   GLUCOSE 171* 143* 189* 163* 152*  BUN 46* 46* 39* 29* 23  CREATININE 2.35* 2.11* 1.63* 1.40* 1.37*  CALCIUM 8.6* 8.7* 8.5* 9.0 8.6*    GFR: Estimated Creatinine Clearance: 49.2 mL/min (A) (by C-G formula based on SCr of 1.37 mg/dL (H)). Liver Function Tests: Recent Labs  Lab 07/17/21 1124 07/18/21 0452  AST 17 14*  ALT 11 11  ALKPHOS 66 62  BILITOT 0.7 0.8  PROT 8.3* 7.7  ALBUMIN 3.3* 3.1*    Recent Labs  Lab 07/17/21 1124 07/20/21 0426  LIPASE 102* 115*    No results for input(s): AMMONIA in the last 168 hours. Coagulation Profile: No results for input(s): INR, PROTIME in the last 168 hours. Cardiac Enzymes: No results for input(s): CKTOTAL, CKMB, CKMBINDEX, TROPONINI in the last 168 hours. BNP (last 3 results) No results for input(s): PROBNP in the last 8760 hours. HbA1C: No results for input(s): HGBA1C in the last 72 hours.  CBG: Recent Labs  Lab 07/21/21 0719 07/21/21 1149 07/21/21 1627 07/21/21 2115 07/22/21 0754  GLUCAP 151* 187* 179* 178*  160*    Lipid  Profile: No results for input(s): CHOL, HDL, LDLCALC, TRIG, CHOLHDL, LDLDIRECT in the last 72 hours. Thyroid Function Tests: No results for input(s): TSH, T4TOTAL, FREET4, T3FREE, THYROIDAB in the last 72 hours. Anemia Panel: No results for input(s): VITAMINB12, FOLATE, FERRITIN, TIBC, IRON, RETICCTPCT in the last 72 hours. Sepsis Labs: No results for input(s): PROCALCITON, LATICACIDVEN in the last 168 hours.  Recent Results (from the past 240 hour(s))  Urine Culture     Status: Abnormal   Collection Time: 07/17/21  3:40 PM   Specimen: Urine, Catheterized  Result Value Ref Range Status   Specimen Description   Final    URINE, CATHETERIZED Performed at [facility], [address], [place], [place] [address]    Special Requests   Final    STERILE CONTAINER Performed at [facility], [address], [place], [place] [address]    Culture >=100,000 COLONIES/mL ENTEROCOCCUS FAECALIS (A)  Final   Report Status 07/21/2021 FINAL  Final   Organism ID, Bacteria ENTEROCOCCUS FAECALIS (A)  Final      Susceptibility   Enterococcus faecalis - MIC*    AMPICILLIN <=2 SENSITIVE Sensitive     NITROFURANTOIN <=16 SENSITIVE Sensitive     VANCOMYCIN 1 SENSITIVE Sensitive     * >=100,000 COLONIES/mL ENTEROCOCCUS FAECALIS  Resp Panel by RT-PCR (Flu A&B, Covid) Nasopharyngeal Swab     Status: None   Collection Time: 07/17/21  4:05 PM   Specimen: Nasopharyngeal Swab; Nasopharyngeal(NP) swabs in vial transport medium  Result Value Ref Range Status   SARS Coronavirus 2 by RT PCR NEGATIVE NEGATIVE Final    Comment: (NOTE) SARS-CoV-2 target nucleic acids are NOT DETECTED.  The SARS-CoV-2 RNA is generally detectable in upper respiratory specimens during the acute phase of infection. The lowest concentration of SARS-CoV-2 viral copies this assay can detect is 138 copies/mL. A negative result does not preclude SARS-Cov-2 infection and should not be used as the sole basis  for treatment or other patient management decisions. A negative result may occur with  improper specimen collection/handling, submission of specimen other than nasopharyngeal swab, presence of viral mutation(s) within the areas targeted by this assay, and inadequate number of viral copies(<138 copies/mL). A negative result must be combined with clinical observations, patient history, and epidemiological information. The expected result is Negative.  Fact Sheet for Patients:  BloggerCourse.com  Fact Sheet for Healthcare Providers:  SeriousBroker.it  This test is no t yet approved or cleared by the [place] FDA and  has been authorized for detection and/or diagnosis of SARS-CoV-2 by FDA under an Emergency Use Authorization (EUA). This EUA will remain  in effect (meaning this test can be used) for the duration of the COVID-19 declaration under Section 564(b)(1) of the Act, 21 U.S.C.section 360bbb-3(b)(1), unless the authorization is terminated  or revoked sooner.       Influenza A by PCR NEGATIVE NEGATIVE Final   Influenza B by PCR NEGATIVE NEGATIVE Final    Comment: (NOTE) The Xpert Xpress SARS-CoV-2/FLU/RSV plus assay is intended as an aid in the diagnosis of influenza from Nasopharyngeal swab specimens and should not be used as a sole basis for treatment. Nasal washings and aspirates are unacceptable for Xpert Xpress SARS-CoV-2/FLU/RSV testing.  Fact Sheet for Patients: BloggerCourse.com  Fact Sheet for Healthcare Providers: SeriousBroker.it  This test is not yet approved or cleared by the [place] FDA and has been authorized for detection and/or diagnosis of SARS-CoV-2 by FDA under an Emergency Use Authorization (EUA). This  EUA will remain in effect (meaning this test can be used) for the duration of the COVID-19 declaration under Section 564(b)(1) of the Act, 21  U.S.C. section 360bbb-3(b)(1), unless the authorization is terminated or revoked.  Performed at [facility], [address] [place], [place] [address]       Radiology Studies: DG Abd 1 View  Result Date: 07/22/2021 CLINICAL DATA:  Fecal impaction EXAM: ABDOMEN - 1 VIEW COMPARISON:  07/20/2021 FINDINGS: No dilated loops of bowel to indicate ileus or obstruction. Continued large volume of colonic stool. Extensive atherosclerotic calcification of visualized arteries. IMPRESSION: 1. Nonobstructive bowel gas pattern. 2. Continued large colonic stool volume. Electronically Signed   By: Acquanetta Belling M.D.   On: 07/22/2021 07:45     LOS: 5 days   Hughie Closs, MD [facility]  07/22/2021, 8:41 AM

## 2021-07-22 NOTE — Care Management Important Message (Signed)
Important Message  Patient Details IM Letter given to the Patient. Name: Dylan Park MRN: [id number] Date of Birth: [birth year]   Medicare Important Message Given:  Yes     Caren Macadam 07/22/2021, 12:55 PM

## 2021-07-23 LAB — BASIC METABOLIC PANEL
Anion gap: 8 (ref 5–15)
BUN: 24 mg/dL — ABNORMAL HIGH (ref 8–23)
CO2: 27 mmol/L (ref 22–32)
Calcium: 8.7 mg/dL — ABNORMAL LOW (ref 8.9–10.3)
Chloride: 101 mmol/L (ref 98–111)
Creatinine, Ser: 1.32 mg/dL — ABNORMAL HIGH (ref 0.61–1.24)
GFR, Estimated: 58 mL/min — ABNORMAL LOW (ref >60)
Glucose, Bld: 160 mg/dL — ABNORMAL HIGH (ref 70–99)
Potassium: 4 mmol/L (ref 3.5–5.1)
Sodium: 136 mmol/L (ref 135–145)

## 2021-07-23 LAB — GLUCOSE, CAPILLARY
Glucose-Capillary: 147 mg/dL — ABNORMAL HIGH (ref 70–99)
Glucose-Capillary: 213 mg/dL — ABNORMAL HIGH (ref 70–99)
Glucose-Capillary: 137 mg/dL — ABNORMAL HIGH (ref 70–99)
Glucose-Capillary: 162 mg/dL — ABNORMAL HIGH (ref 70–99)

## 2021-07-23 MED ORDER — METOPROLOL SUCCINATE ER 200 MG PO TB24: 200.0000 mg | ORAL_TABLET | Freq: Every day | ORAL | 0 refills | Status: DC

## 2021-07-23 MED ORDER — TAMSULOSIN HCL 0.4 MG PO CAPS: 0.4000 mg | ORAL_CAPSULE | Freq: Every day | ORAL | 0 refills | Status: AC

## 2021-07-23 MED ORDER — NITROFURANTOIN MONOHYD MACRO 100 MG PO CAPS: 100.0000 mg | ORAL_CAPSULE | Freq: Two times a day (BID) | ORAL | 0 refills | Status: AC

## 2021-07-23 NOTE — Discharge Summary (Signed)
Physician Discharge Summary  Dylan Park EKC:[id number] DOB: 26-Jan-1951 DOA: 07/17/2021  PCP: Elijio Miles., MD  Admit date: 07/17/2021 Discharge date: 07/23/2021 30 Day Unplanned Readmission Risk Score    Flowsheet Row ED to Hosp-Admission (Current) from 07/17/2021 in [place] 4TH FLOOR PROGRESSIVE CARE AND UROLOGY  30 Day Unplanned Readmission Risk Score (%) 13.98 Filed at 07/23/2021 0801       This score is the patient's risk of an unplanned readmission within 30 days of being discharged (0 -100%). The score is based on dignosis, age, lab data, medications, orders, and past utilization.   Low:  0-14.9   Medium: 15-21.9   High: 22-29.9   Extreme: 30 and above          Admitted From: Home Disposition: Home  Recommendations for Outpatient Follow-up:  Follow up with PCP in 1-2 weeks Please obtain BMP/CBC in one week Please follow up with your PCP on the following pending results: Unresulted Labs (From admission, onward)    None         Home Health: None Equipment/Devices: None  Discharge Condition: Stable CODE STATUS: Full code Diet recommendation: Regular  Subjective: Seen and examined.  No new complaint.  Still believes that he has constipation however he has had 5-6 bowel movements in last 48 hours and appears frustrated because of that.  Brief/Interim Summary: 70 year old male with history of A. fib/stroke bedbound due to stroke x3 months, alcoholic liver disease, diabetes, hypertension, hyperlipidemia, history of CHF presented to ED due to complaint of constipation and abdominal pain for >1wk refractory to home regimen. He was found to have fecal impaction, constipation, AKI, hypokalemia, Patient is being treated for AKI obstructive uropathy, Foley was placed and hematuria discovered. Urology following.  AKI 2/2 volume depletion and obstructive uropathy  Urine retention 2/2 BPH c/b hydronephrosis  Secondary to prerenal + obstruction; improving Foley was  placed w/gross hematuria without clots, drained 2000 ml output. - Urology followed signed off.  Hematuria resolved.  They recommended continuing indwelling Foley catheter for a month and follow-up with them as outpatient.  Patient is being discharged on Flomax.  Patient's renal function improved significantly.  Currently creatinine 1.37.  GFR 58.   Fecal impaction/constipation Given patient immobility, suspect this has likely contributed to significant constipation and therefore increased risk for UTI. - KUB c/w impaction and severe stool burden.he was started on aggressive bowel regimen: BID miralax, daily suppository, prn Mg citrate.  Enemas were tried yesterday and day before.  Per chart and per nursing report, patient has had about 5-6 bowel movements in last 48 hours.  Abdomen not distended.  Nontender.   UTI POA Enterococcus UTI Leukocytosis improving. Urine Cx with E. faecalis - started on ceftriaxone and transition to nitrofurantoin 100 mg p.o. twice daily.  We will continue this for 5 more days.   History of A. Fib/A Tach cont Toprol.  Patient does not appear to be on any anticoagulation.  Patient is a very poor historian.  Wife also does not speak [place].  Unable to obtain any further history about that.  Follow with cardiology.   History of recent stroke/bedbound Not on antiplatelets - continue statin.  PT OT was consulted for this patient however patient declined to work with them and they signed off.   Pressure injury POA: - wound care consult. Diet Order                  Diet heart healthy/carb modified Room service appropriate? Yes; Fluid consistency:  Thin  Diet effective now                       Patient's Body mass index is 19.89 kg/m. caloric intake. Pressure Injury 07/17/21 Ankle Right;Lateral Unstageable - Full thickness tissue loss in which the base of the injury is covered by slough (yellow, tan, gray, green or brown) and/or eschar (tan, brown or black) in the  wound bed. (Active)  07/17/21 2056  Location: Ankle  Location Orientation: Right;Lateral  Staging: Unstageable - Full thickness tissue loss in which the base of the injury is covered by slough (yellow, tan, gray, green or brown) and/or eschar (tan, brown or black) in the wound bed.  Wound Description (Comments):  Present on Admission: Yes    Discharge Diagnoses:  Active Problems:   AKI (acute kidney injury) (HCC)   Fecal impaction (HCC)  Discharge Instructions  Allergies as of 07/23/2021       Reactions   Lisinopril Other (See Comments)   Low heart rate and BP   Penicillins Rash        Medication List     STOP taking these medications    furosemide 20 MG tablet Commonly known as: LASIX   lisinopril 20 MG tablet Commonly known as: ZESTRIL       TAKE these medications    metFORMIN 500 MG tablet Commonly known as: GLUCOPHAGE Take 500 mg by mouth 2 (two) times daily.   metoprolol 200 MG 24 hr tablet Commonly known as: TOPROL-XL Take 1 tablet (200 mg total) by mouth daily. Take with or immediately following a meal. What changed: additional instructions   nitrofurantoin (macrocrystal-monohydrate) 100 MG capsule Commonly known as: MACROBID Take 1 capsule (100 mg total) by mouth every 12 (twelve) hours for 5 days.   rosuvastatin 40 MG tablet Commonly known as: CRESTOR Take 40 mg by mouth daily.   tamsulosin 0.4 MG Caps capsule Commonly known as: Flomax Take 1 capsule (0.4 mg total) by mouth daily after supper.        Follow-up Information     Elijio Miles., MD Follow up in 1 week(s).   Specialty: Family Medicine Contact information: [address] [place] [address] [phone]                Allergies  Allergen Reactions   Lisinopril Other (See Comments)    Low heart rate and BP   Penicillins Rash    Consultations: Urology   Procedures/Studies: CT Abdomen Pelvis Wo Contrast  Result Date: 07/17/2021 CLINICAL DATA:   Abdominal distension, constipation. EXAM: CT ABDOMEN AND PELVIS WITHOUT CONTRAST TECHNIQUE: Multidetector CT imaging of the abdomen and pelvis was performed following the standard protocol without IV contrast. COMPARISON:  None. FINDINGS: Lower chest: No acute abnormality. Hepatobiliary: Cholelithiasis is noted. No biliary dilatation is noted. The liver is unremarkable. Pancreas: Unremarkable. No pancreatic ductal dilatation or surrounding inflammatory changes. Spleen: Normal in size without focal abnormality. Adrenals/Urinary Tract: Adrenal glands appear normal. Mild bilateral hydroureteronephrosis is noted. Severe urinary bladder distention is noted concerning for bladder outlet obstruction. Stomach/Bowel: The stomach appears normal. No small bowel dilatation is noted. Large amount of stool seen throughout the colon. The appendix appears normal. Large amount of stool is noted in the rectum concerning for rectal impaction. Vascular/Lymphatic: Aortic atherosclerosis. No enlarged abdominal or pelvic lymph nodes. Reproductive: Mild prostatic enlargement is noted. Other: No abdominal wall hernia or abnormality. No abdominopelvic ascites. Musculoskeletal: No acute or significant osseous findings. IMPRESSION: Mild  bilateral hydroureteronephrosis is noted secondary to severe urinary bladder distension, which in turn most likely is due to bladder outlet obstruction, potentially due to enlarged prostate gland. Cholelithiasis. Large amount of stool seen in the rectum concerning for rectal impaction. Aortic Atherosclerosis (ICD10-I70.0). Electronically Signed   By: Lupita Raider M.D.   On: 07/17/2021 14:52   DG Abd 1 View  Result Date: 07/22/2021 CLINICAL DATA:  Fecal impaction EXAM: ABDOMEN - 1 VIEW COMPARISON:  07/20/2021 FINDINGS: No dilated loops of bowel to indicate ileus or obstruction. Continued large volume of colonic stool. Extensive atherosclerotic calcification of visualized arteries. IMPRESSION: 1.  Nonobstructive bowel gas pattern. 2. Continued large colonic stool volume. Electronically Signed   By: Acquanetta Belling M.D.   On: 07/22/2021 07:45   DG Abd 1 View  Result Date: 07/20/2021 CLINICAL DATA:  Fecal impaction.  Abdominal distension. EXAM: ABDOMEN - 1 VIEW COMPARISON:  07/17/2021 FINDINGS: Prominent stool again noted right colon with increasing stool in the transverse colon and splenic flexure. Similar rectal stool burden. Nonspecific bowel gas pattern. Bones are diffusely demineralized. Arterial vascular calcification again noted. IMPRESSION: Slight increase in colonic stool volume with similar rectal stool burden. Electronically Signed   By: Kennith Center M.D.   On: 07/20/2021 08:16   DG ABD ACUTE 2+V W 1V CHEST  Result Date: 07/17/2021 CLINICAL DATA:  Abdominal pain and constipation EXAM: DG ABDOMEN ACUTE WITH 1 VIEW CHEST COMPARISON:  11/19/2013 FINDINGS: Cardiac shadow is within normal limits. The lungs are well aerated bilaterally. Vascular calcifications are seen. No acute bony abnormality is noted. Scattered large and small bowel gas is noted. Retained fecal material is noted in the right colon consistent with a mild degree of constipation. Heavy vascular calcifications are noted. No free air is noted. No obstructive changes are seen. IMPRESSION: Changes of mild constipation. Aortic Atherosclerosis (ICD10-I70.0). Electronically Signed   By: Alcide Clever M.D.   On: 07/17/2021 12:07     Discharge Exam: Vitals:   07/23/21 0620 07/23/21 0951  BP: 139/68 135/62  Pulse: 61 64  Resp: 16   Temp: 97.7 F (36.5 C)   SpO2: 98%    Vitals:   07/22/21 1441 07/22/21 2100 07/23/21 0620 07/23/21 0951  BP: 111/64 137/70 139/68 135/62  Pulse: 63 61 61 64  Resp: Temp: (!) 97.5 F (36.4 C) 97.9 F (36.6 C) 97.7 F (36.5 C)   TempSrc: Oral Oral Oral   SpO2: 100% 98% 98%   Weight:      Height:        General: Pt is alert, awake, not in acute distress Cardiovascular: RRR, S1/S2  +, no rubs, no gallops Respiratory: CTA bilaterally, no wheezing, no rhonchi Abdominal: Soft, NT, ND, bowel sounds + Extremities: no edema, no cyanosis    The results of significant diagnostics from this hospitalization (including imaging, microbiology, ancillary and laboratory) are listed below for reference.     Microbiology: Recent Results (from the past 240 hour(s))  Urine Culture     Status: Abnormal   Collection Time: 07/17/21  3:40 PM   Specimen: Urine, Catheterized  Result Value Ref Range Status   Specimen Description   Final    URINE, CATHETERIZED Performed at [facility], [address], [place], [place] [address]    Special Requests   Final    STERILE CONTAINER Performed at [facility], [address] [place], [place] [address]    Culture >=100,000 COLONIES/mL ENTEROCOCCUS  FAECALIS (A)  Final   Report Status 07/21/2021 FINAL  Final   Organism ID, Bacteria ENTEROCOCCUS FAECALIS (A)  Final      Susceptibility   Enterococcus faecalis - MIC*    AMPICILLIN <=2 SENSITIVE Sensitive     NITROFURANTOIN <=16 SENSITIVE Sensitive     VANCOMYCIN 1 SENSITIVE Sensitive     * >=100,000 COLONIES/mL ENTEROCOCCUS FAECALIS  Resp Panel by RT-PCR (Flu A&B, Covid) Nasopharyngeal Swab     Status: None   Collection Time: 07/17/21  4:05 PM   Specimen: Nasopharyngeal Swab; Nasopharyngeal(NP) swabs in vial transport medium  Result Value Ref Range Status   SARS Coronavirus 2 by RT PCR NEGATIVE NEGATIVE Final    Comment: (NOTE) SARS-CoV-2 target nucleic acids are NOT DETECTED.  The SARS-CoV-2 RNA is generally detectable in upper respiratory specimens during the acute phase of infection. The lowest concentration of SARS-CoV-2 viral copies this assay can detect is 138 copies/mL. A negative result does not preclude SARS-Cov-2 infection and should not be used as the sole basis for treatment or other patient management decisions. A negative result may occur with  improper  specimen collection/handling, submission of specimen other than nasopharyngeal swab, presence of viral mutation(s) within the areas targeted by this assay, and inadequate number of viral copies(<138 copies/mL). A negative result must be combined with clinical observations, patient history, and epidemiological information. The expected result is Negative.  Fact Sheet for Patients:  BloggerCourse.comhttps://www.fda.gov/media/152166/download  Fact Sheet for Healthcare Providers:  SeriousBroker.ithttps://www.fda.gov/media/152162/download  This test is no t yet approved or cleared by the [place] FDA and  has been authorized for detection and/or diagnosis of SARS-CoV-2 by FDA under an Emergency Use Authorization (EUA). This EUA will remain  in effect (meaning this test can be used) for the duration of the COVID-19 declaration under Section 564(b)(1) of the Act, 21 U.S.C.section 360bbb-3(b)(1), unless the authorization is terminated  or revoked sooner.       Influenza A by PCR NEGATIVE NEGATIVE Final   Influenza B by PCR NEGATIVE NEGATIVE Final    Comment: (NOTE) The Xpert Xpress SARS-CoV-2/FLU/RSV plus assay is intended as an aid in the diagnosis of influenza from Nasopharyngeal swab specimens and should not be used as a sole basis for treatment. Nasal washings and aspirates are unacceptable for Xpert Xpress SARS-CoV-2/FLU/RSV testing.  Fact Sheet for Patients: BloggerCourse.comhttps://www.fda.gov/media/152166/download  Fact Sheet for Healthcare Providers: SeriousBroker.ithttps://www.fda.gov/media/152162/download  This test is not yet approved or cleared by the [place] FDA and has been authorized for detection and/or diagnosis of SARS-CoV-2 by FDA under an Emergency Use Authorization (EUA). This EUA will remain in effect (meaning this test can be used) for the duration of the COVID-19 declaration under Section 564(b)(1) of the Act, 21 U.S.C. section 360bbb-3(b)(1), unless the authorization is terminated or revoked.  Performed at  [facility], [address] [place], [place] [id number]      Labs: BNP (last 3 results) No results for input(s): BNP in the last 8760 hours. Basic Metabolic Panel: Recent Labs  Lab 07/19/21 0428 07/20/21 0426 07/21/21 0552 07/22/21 0443 07/23/21 0522  NA 134* 135 141 135 136  K 4.4 3.7 4.2 4.0 4.0  CL 103 103 105 102 101  CO2 23 25 28 27 27   GLUCOSE 143* 189* 163* 152* 160*  BUN 46* 39* 29* 23 24*  CREATININE 2.11* 1.63* 1.40* 1.37* 1.32*  CALCIUM 8.7* 8.5* 9.0 8.6* 8.7*   Liver Function Tests: Recent Labs  Lab 07/17/21 1124 07/18/21 0452  AST 17 14*  ALT 11 11  ALKPHOS 66 62  BILITOT 0.7 0.8  PROT 8.3* 7.7  ALBUMIN 3.3* 3.1*   Recent Labs  Lab 07/17/21 1124 07/20/21 0426  LIPASE 102* 115*   No results for input(s): AMMONIA in the last 168 hours. CBC: Recent Labs  Lab 07/17/21 1124 07/18/21 0452 07/18/21 1356 07/19/21 0428 07/19/21 0947 07/20/21 0426 07/20/21 1647 07/21/21 0552 07/22/21 0443  WBC 13.4* 18.0*  --  14.9*  --  12.6*  --  10.5 13.0*  NEUTROABS 10.2*  --   --   --   --   --   --   --   --   HGB 13.1 13.9   < > 11.0*  11.1* 11.3* 10.3* 11.8* 10.8* 11.1*  HCT 38.3* 42.5   < > 33.5*  33.8* 34.1* 31.5* 36.3* 33.3* 34.2*  MCV 91.0 92.4  --  94.4  --  94.6  --  94.9 94.7  PLT 318 340  --  268  --  258  --  245 262   < > = values in this interval not displayed.   Cardiac Enzymes: No results for input(s): CKTOTAL, CKMB, CKMBINDEX, TROPONINI in the last 168 hours. BNP: Invalid input(s): POCBNP CBG: Recent Labs  Lab 07/22/21 0754 07/22/21 1112 07/22/21 1606 07/22/21 2103 07/23/21 0737  GLUCAP 160* 258* 136* 225* 147*   D-Dimer No results for input(s): DDIMER in the last 72 hours. Hgb A1c No results for input(s): HGBA1C in the last 72 hours. Lipid Profile No results for input(s): CHOL, HDL, LDLCALC, TRIG, CHOLHDL, LDLDIRECT in the last 72 hours. Thyroid function studies No results for input(s): TSH, T4TOTAL, T3FREE,  THYROIDAB in the last 72 hours.  Invalid input(s): FREET3 Anemia work up No results for input(s): VITAMINB12, FOLATE, FERRITIN, TIBC, IRON, RETICCTPCT in the last 72 hours. Urinalysis    Component Value Date/Time   COLORURINE YELLOW 07/17/2021 1540   APPEARANCEUR TURBID (A) 07/17/2021 1540   LABSPEC 1.020 07/17/2021 1540   PHURINE 5.0 07/17/2021 1540   GLUCOSEU NEGATIVE 07/17/2021 1540   HGBUR LARGE (A) 07/17/2021 1540   BILIRUBINUR NEGATIVE 07/17/2021 1540   KETONESUR NEGATIVE 07/17/2021 1540   PROTEINUR 100 (A) 07/17/2021 1540   NITRITE NEGATIVE 07/17/2021 1540   LEUKOCYTESUR LARGE (A) 07/17/2021 1540   Sepsis Labs Invalid input(s): PROCALCITONIN,  WBC,  LACTICIDVEN Microbiology Recent Results (from the past 240 hour(s))  Urine Culture     Status: Abnormal   Collection Time: 07/17/21  3:40 PM   Specimen: Urine, Catheterized  Result Value Ref Range Status   Specimen Description   Final    URINE, CATHETERIZED Performed at [facility], [address] [place], [place] [address]    Special Requests   Final    STERILE CONTAINER Performed at [facility], [address] [place], [place] [address]    Culture >=100,000 COLONIES/mL ENTEROCOCCUS FAECALIS (A)  Final   Report Status 07/21/2021 FINAL  Final   Organism ID, Bacteria ENTEROCOCCUS FAECALIS (A)  Final      Susceptibility   Enterococcus faecalis - MIC*    AMPICILLIN <=2 SENSITIVE Sensitive     NITROFURANTOIN <=16 SENSITIVE Sensitive     VANCOMYCIN 1 SENSITIVE Sensitive     * >=100,000 COLONIES/mL ENTEROCOCCUS FAECALIS  Resp Panel by RT-PCR (Flu A&B, Covid) Nasopharyngeal Swab     Status: None   Collection Time: 07/17/21  4:05 PM   Specimen: Nasopharyngeal Swab; Nasopharyngeal(NP) swabs in vial transport  medium  Result Value Ref Range Status   SARS Coronavirus 2 by RT PCR NEGATIVE NEGATIVE Final    Comment: (NOTE) SARS-CoV-2 target nucleic acids are NOT DETECTED.  The SARS-CoV-2 RNA is generally  detectable in upper respiratory specimens during the acute phase of infection. The lowest concentration of SARS-CoV-2 viral copies this assay can detect is 138 copies/mL. A negative result does not preclude SARS-Cov-2 infection and should not be used as the sole basis for treatment or other patient management decisions. A negative result may occur with  improper specimen collection/handling, submission of specimen other than nasopharyngeal swab, presence of viral mutation(s) within the areas targeted by this assay, and inadequate number of viral copies(<138 copies/mL). A negative result must be combined with clinical observations, patient history, and epidemiological information. The expected result is Negative.  Fact Sheet for Patients:  BloggerCourse.com  Fact Sheet for Healthcare Providers:  SeriousBroker.it  This test is no t yet approved or cleared by the [place] FDA and  has been authorized for detection and/or diagnosis of SARS-CoV-2 by FDA under an Emergency Use Authorization (EUA). This EUA will remain  in effect (meaning this test can be used) for the duration of the COVID-19 declaration under Section 564(b)(1) of the Act, 21 U.S.C.section 360bbb-3(b)(1), unless the authorization is terminated  or revoked sooner.       Influenza A by PCR NEGATIVE NEGATIVE Final   Influenza B by PCR NEGATIVE NEGATIVE Final    Comment: (NOTE) The Xpert Xpress SARS-CoV-2/FLU/RSV plus assay is intended as an aid in the diagnosis of influenza from Nasopharyngeal swab specimens and should not be used as a sole basis for treatment. Nasal washings and aspirates are unacceptable for Xpert Xpress SARS-CoV-2/FLU/RSV testing.  Fact Sheet for Patients: BloggerCourse.com  Fact Sheet for Healthcare Providers: SeriousBroker.it  This test is not yet approved or cleared by the [place] FDA  and has been authorized for detection and/or diagnosis of SARS-CoV-2 by FDA under an Emergency Use Authorization (EUA). This EUA will remain in effect (meaning this test can be used) for the duration of the COVID-19 declaration under Section 564(b)(1) of the Act, 21 U.S.C. section 360bbb-3(b)(1), unless the authorization is terminated or revoked.  Performed at [facility], [address] [place], [place] [address]      Time coordinating discharge: Over 30 minutes  SIGNED:   Hughie Closs, MD  [facility] 07/23/2021, 10:52 AM  If 7PM-7AM, please contact night-coverage [email or web address]

## 2021-07-23 NOTE — Progress Notes (Signed)
Pt to be discharged to home this afternoon. Pt's Wife informed of pending discharge via phone. Pt speaks Spanish and limited [place]. Pt's wife asked to come to the hospital for foley teaching and discharge teaching.Discharge instructions including Foley Catheter care how to empty foley bag and care of foley catheter reviewed with Pt's Wife with a NT who speaks Spanish interpreting the instructions to the Pt's Wife. Pt's Wife verbalized understanding and demonstrated the steps to empty the foley catheter. Discharge AVS with Pt's Wife at discharge

## 2021-07-23 NOTE — TOC Transition Note (Signed)
Transition of Care [facility]) - CM/SW Discharge Note   Patient Details  Name: Dylan Park MRN: [id number] Date of Birth: 02-09-1951  [facility] CM/SW Contact:  Lanier Clam, RN Phone Number: 07/23/2021, 2:23 PM   Clinical Narrative:  d/c home by PTAR-scheduled for 3p pick up. No orders.No further CM needs.           Patient Goals and CMS Choice        Discharge Placement                       Discharge Plan and Services                                     Social Determinants of Health (SDOH) Interventions     Readmission Risk Interventions No flowsheet data found.

## 2022-08-06 ENCOUNTER — Other Ambulatory Visit: Payer: Self-pay

## 2022-08-06 ENCOUNTER — Emergency Department (HOSPITAL_COMMUNITY)
Admission: EM | Admit: 2022-08-06 | Discharge: 2022-08-06 | Disposition: A | Discharge status: Home / Self Care | Payer: Medicare HMO | Attending: Emergency Medicine | Admitting: Emergency Medicine

## 2022-08-06 DIAGNOSIS — T83098A Other mechanical complication of other indwelling urethral catheter, initial encounter: Secondary | ICD-10-CM | POA: Diagnosis present

## 2022-08-06 DIAGNOSIS — Y69 Unspecified misadventure during surgical and medical care: Secondary | ICD-10-CM | POA: Diagnosis not present

## 2022-08-06 DIAGNOSIS — N390 Urinary tract infection, site not specified: Secondary | ICD-10-CM | POA: Insufficient documentation

## 2022-08-06 LAB — CBC WITH DIFFERENTIAL/PLATELET
Abs Immature Granulocytes: 0.02 10*3/uL (ref 0.00–0.07)
Basophils Absolute: 0.1 10*3/uL (ref 0.0–0.1)
Basophils Relative: 1 %
Eosinophils Absolute: 0.1 10*3/uL (ref 0.0–0.5)
Eosinophils Relative: 1 %
HCT: 42.4 % (ref 39.0–52.0)
Hemoglobin: 14.5 g/dL (ref 13.0–17.0)
Immature Granulocytes: 0 %
Lymphocytes Relative: 28 %
Lymphs Abs: 2.6 10*3/uL (ref 0.7–4.0)
MCH: 31.7 pg (ref 26.0–34.0)
MCHC: 34.2 g/dL (ref 30.0–36.0)
MCV: 92.8 fL (ref 80.0–100.0)
Monocytes Absolute: 0.8 10*3/uL (ref 0.1–1.0)
Monocytes Relative: 9 %
Neutro Abs: 5.7 10*3/uL (ref 1.7–7.7)
Neutrophils Relative %: 61 %
Platelets: 160 10*3/uL (ref 150–400)
RBC: 4.57 MIL/uL (ref 4.22–5.81)
RDW: 13.2 % (ref 11.5–15.5)
WBC: 9.3 10*3/uL (ref 4.0–10.5)
nRBC: 0 % (ref 0.0–0.2)

## 2022-08-06 LAB — COMPREHENSIVE METABOLIC PANEL
ALT: 12 U/L (ref 0–44)
AST: 15 U/L (ref 15–41)
Albumin: 3.7 g/dL (ref 3.5–5.0)
Alkaline Phosphatase: 85 U/L (ref 38–126)
Anion gap: 6 (ref 5–15)
BUN: 22 mg/dL (ref 8–23)
CO2: 25 mmol/L (ref 22–32)
Calcium: 9.2 mg/dL (ref 8.9–10.3)
Chloride: 110 mmol/L (ref 98–111)
Creatinine, Ser: 1.07 mg/dL (ref 0.61–1.24)
GFR, Estimated: >60 mL/min (ref >60)
Glucose, Bld: 228 mg/dL — ABNORMAL HIGH (ref 70–99)
Potassium: 4 mmol/L (ref 3.5–5.1)
Sodium: 141 mmol/L (ref 135–145)
Total Bilirubin: 0.6 mg/dL (ref 0.3–1.2)
Total Protein: 6.7 g/dL (ref 6.5–8.1)

## 2022-08-06 LAB — URINALYSIS, ROUTINE W REFLEX MICROSCOPIC
Bilirubin Urine: NEGATIVE
Glucose, UA: NEGATIVE mg/dL
Ketones, ur: NEGATIVE mg/dL
Nitrite: POSITIVE — AB
Protein, ur: 100 mg/dL — AB
RBC / HPF: >50 RBC/hpf — ABNORMAL HIGH (ref 0–5)
Specific Gravity, Urine: 1.017 (ref 1.005–1.030)
WBC, UA: >50 WBC/hpf — ABNORMAL HIGH (ref 0–5)
pH: 6 (ref 5.0–8.0)

## 2022-08-06 MED ORDER — SODIUM CHLORIDE 0.9 % IV SOLN
1.0000 g | INTRAVENOUS | Status: DC
  Administered 2022-08-06: 1 g via INTRAVENOUS
  Filled 2022-08-06: qty 10

## 2022-08-06 MED ORDER — PHENAZOPYRIDINE HCL 200 MG PO TABS: 200.0000 mg | ORAL_TABLET | Freq: Three times a day (TID) | ORAL | 0 refills | Status: AC | PRN

## 2022-08-06 MED ORDER — NITROFURANTOIN MONOHYD MACRO 100 MG PO CAPS
100.0000 mg | ORAL_CAPSULE | Freq: Once | ORAL | Status: AC
  Administered 2022-08-06: 100 mg via ORAL
  Filled 2022-08-06: qty 1

## 2022-08-06 MED ORDER — NITROFURANTOIN MONOHYD MACRO 100 MG PO CAPS: 100.0000 mg | ORAL_CAPSULE | Freq: Two times a day (BID) | ORAL | 0 refills | Status: AC

## 2022-08-06 MED ORDER — CEPHALEXIN 500 MG PO CAPS: 500.0000 mg | ORAL_CAPSULE | Freq: Two times a day (BID) | ORAL | 0 refills | Status: AC

## 2022-08-06 NOTE — ED Notes (Signed)
New drainage bag attached due to old one leaking

## 2022-08-06 NOTE — Discharge Instructions (Addendum)
Your evaluated in the emergency department for decreased urinary output.  We are confident that we have ruled out a urinary obstruction.  Based on a scan, you do not appear to be retaining urine.  Your Foley catheter is patent.  Your urinalysis was notable for signs of a catheter associated urinary tract infection.  You were treated with a dose of IV antibiotics while hospitalized.  You were discharged home to complete a course of oral antibiotics for your infection.  Please return to the hospital for signs and symptoms of worsening UTI including, but not limited to: New or worsening pelvic pain, flank pain, further decreased urinary output, fevers greater than 100.4 F, confusion.

## 2022-08-06 NOTE — ED Triage Notes (Signed)
Patient coming to ED for evaluation of urinary retention.  Reports "my foley is not draining.  It hasn't been draining more than a few drips for the last few days."  Foley was replaced "earlier this month."  No c/o pain.  Foley is a chronic indwelling catheter.

## 2022-08-06 NOTE — ED Provider Notes (Cosign Needed Addendum)
[facility]-EMERGENCY DEPT Provider Note   CSN: [id number] Arrival date & time: 08/06/22  1949     History  Chief Complaint  Patient presents with   Urinary Retention    Dylan Park is a 71 y.o. male presents to the emergency department for 2 to 3 days of decreased drainage of urine into Foley bag.  Associated with spasmodic bladder pain.  Prior to this he was in his usual state of health.  Denies flank pain.  Denies fevers, chills.  No chest pain or shortness of breath.  Medical history is notable for CVA with right-sided deficits and neurogenic bladder.  HPI     Home Medications Prior to Admission medications   Medication Sig Start Date End Date Taking? Authorizing Provider  cephALEXin (KEFLEX) 500 MG capsule Take 1 capsule (500 mg total) by mouth 2 (two) times daily for 7 days. 08/06/22 08/13/22 Yes Marrianne Mood, MD  INSULIN ASPART [place] Inject into the skin.   Yes [provider]  metFORMIN (GLUCOPHAGE) 500 MG tablet Take 500 mg by mouth 2 (two) times daily. 06/29/21  Yes [provider]  metoprolol succinate (TOPROL-XL) 200 MG 24 hr tablet Take 1 tablet (200 mg total) by mouth daily. Take with or immediately following a meal. 07/23/21 08/06/22 Yes Pahwani, Daleen Bo, MD  nitrofurantoin, macrocrystal-monohydrate, (MACROBID) 100 MG capsule Take 1 capsule (100 mg total) by mouth 2 (two) times daily for 7 days. 08/06/22 08/13/22 Yes Marrianne Mood, MD  phenazopyridine (PYRIDIUM) 200 MG tablet Take 1 tablet (200 mg total) by mouth 3 (three) times daily as needed for up to 2 days for pain. 08/06/22 08/08/22 Yes Marrianne Mood, MD  rosuvastatin (CRESTOR) 40 MG tablet Take 40 mg by mouth daily. 06/29/21  Yes [provider]      Allergies    Lisinopril and Penicillins    Review of Systems   Review of Systems  All other systems reviewed and are negative.   Physical Exam Updated Vital Signs BP (!) 173/91   Pulse 62   Temp 98.4 F (36.9 C)  (Oral)   Resp 16   Ht 5\' 10"  (1.778 m)   Wt 68 kg   SpO2 98%   BMI 21.52 kg/m  Physical Exam Vitals and nursing note reviewed.  Constitutional:      General: He is not in acute distress.    Appearance: He is well-developed.  HENT:     Head: Normocephalic and atraumatic.  Eyes:     Conjunctiva/sclera: Conjunctivae normal.  Cardiovascular:     Rate and Rhythm: Normal rate and regular rhythm.     Heart sounds: No murmur heard. Pulmonary:     Effort: Pulmonary effort is normal. No respiratory distress.     Breath sounds: Normal breath sounds.  Abdominal:     Palpations: Abdomen is soft.     Tenderness: There is no abdominal tenderness.  Genitourinary:    Penis: Normal.      Comments: No erythema around urethral meatus.  100 to 200 cc of cloudy urine in the Foley bag. Musculoskeletal:        General: No swelling.     Cervical back: Neck supple.  Skin:    General: Skin is warm and dry.     Capillary Refill: Capillary refill takes less than 2 seconds.  Neurological:     Mental Status: He is alert. Mental status is at baseline.     Comments: Right leg weakness.  Psychiatric:  Mood and Affect: Mood normal.     ED Results / Procedures / Treatments   Labs (all labs ordered are listed, but only abnormal results are displayed) Labs Reviewed  URINALYSIS, ROUTINE W REFLEX MICROSCOPIC - Abnormal; Notable for the following components:      Result Value   APPearance TURBID (*)    Hgb urine dipstick MODERATE (*)    Protein, ur 100 (*)    Nitrite POSITIVE (*)    Leukocytes,Ua LARGE (*)    RBC / HPF >50 (*)    WBC, UA >50 (*)    Bacteria, UA MANY (*)    All other components within normal limits  COMPREHENSIVE METABOLIC PANEL - Abnormal; Notable for the following components:   Glucose, Bld 228 (*)    All other components within normal limits  URINE CULTURE  CBC WITH DIFFERENTIAL/PLATELET    EKG None  Radiology No results found.  Medications Ordered in  ED Medications  cefTRIAXone (ROCEPHIN) 1 g in sodium chloride 0.9 % 100 mL IVPB (1 g Intravenous New Bag/Given 08/06/22 2225)  nitrofurantoin (macrocrystal-monohydrate) (MACROBID) capsule 100 mg (100 mg Oral Given 08/06/22 2226)    ED Course/ Medical Decision Making/ A&P                           Medical Decision Making Risk Prescription drug management.   Dylan Park is a 71 year old male with history of CVA right-sided deficits, chronic indwelling Foley catheter for neurogenic bladder, who presents with 2 to 3 days of decreased urinary output and spasmodic bladder pain.  Afebrile and hemodynamically stable.  No signs of systemic illness.  Foley catheter is flushing well.  Bladder scan does not show urinary retention.  Creatinine is normal.  UA notable for significant pyuria and hematuria.  Prior urine cultures have yielded E faecalis sensitive to Macrobid.  Patient will receive dose of IV ceftriaxone while in the ED plus dose of Macrobid and will be discharged home with oral antibiotics.   Co morbidities that complicate the patient evaluation  History of CVA with right-sided deficits   Social Determinants of Health:  Lives at home with wife.  Uses wheelchair for mobility.  Record falls.   Additional history obtained:  Additional history and/or information obtained from chart review External records from outside source obtained and reviewed including charts from prior urology encounters   Lab Tests:  I Ordered (or co-signed), and personally interpreted labs.  The pertinent results include: CMP within normal limits CBC within normal limits UA with hematuria and pyuria  Medicines ordered and prescription drug management:  I ordered medication including ceftriaxone 1 g IV, Macrobid 100 mg p.o. Reevaluation of the patient after these medicines showed that the patient stayed the same  Reevaluation:  After the interventions noted above, I reevaluated the patient and found that  they have :stayed the same.   Dispostion:  After consideration of the diagnostic results and the patients response to treatment, I feel that the patent would benefit from discharge to home with Macrobid, Keflex, Pyridium for presumed faecalis UTI, and follow-up with PCP and/or urology.    Final Clinical Impression(s) / ED Diagnoses Final diagnoses:  Urinary tract infection associated with indwelling urethral catheter, initial encounter (HCC)    Rx / DC Orders ED Discharge Orders          Ordered    nitrofurantoin, macrocrystal-monohydrate, (MACROBID) 100 MG capsule  2 times daily  08/06/22 2328    cephALEXin (KEFLEX) 500 MG capsule  2 times daily        08/06/22 2331    phenazopyridine (PYRIDIUM) 200 MG tablet  3 times daily PRN        08/06/22 2332              Nani Gasser, MD 08/06/22 2303    Nani Gasser, MD 08/06/22 LC:[id number]    Isla Pence, MD 08/07/22 1534

## 2022-08-06 NOTE — ED Notes (Signed)
Foley irrigated with 21ml NS and flowing with light yellow urine and sediment.

## 2022-08-06 NOTE — ED Provider Triage Note (Signed)
Emergency Medicine Provider Triage Evaluation Note  Dylan Park , a 71 y.o. male  was evaluated in triage.  Pt complains of urinary retention.  Patient has a chronic indwelling Foley catheter which was present for approximately 1 year.  He notes decrease in urine output over the past 2 days with minimal urine in his bag.  He denies any abdominal tenderness, nausea, vomiting, fever, hematuria, dysuria.  His wife accompanies him who states that the patient looks physically ill compared to his baseline.  Review of Systems  Positive: See above Negative:   Physical Exam  BP 124/77 (BP Location: Left Arm)   Pulse 87   Temp 98.4 F (36.9 C) (Oral)   Resp 16   Ht 5\' 10"  (1.778 m)   Wt 68 kg   SpO2 100%   BMI 21.52 kg/m  Gen:   Awake, no distress   Resp:  Normal effort  MSK:   Moves extremities without difficulty  Other:  No abdominal tenderness.  Medical Decision Making  Medically screening exam initiated at 8:26 PM.  Appropriate orders placed.  Mishael Haran was informed that the remainder of the evaluation will be completed by another provider, this initial triage assessment does not replace that evaluation, and the importance of remaining in the ED until their evaluation is complete.     Marchia Meiers, Peter Garter 08/06/22 2027

## 2022-08-08 LAB — URINE CULTURE

## 2022-12-22 IMAGING — DX DG ABDOMEN 1V
2 series · 2 of 2 positions shown · non-contrast
Comparison: 07/20/2021

CLINICAL DATA: Fecal impaction

EXAM:
ABDOMEN - 1 VIEW

[abdomen kub (1 of 2)]
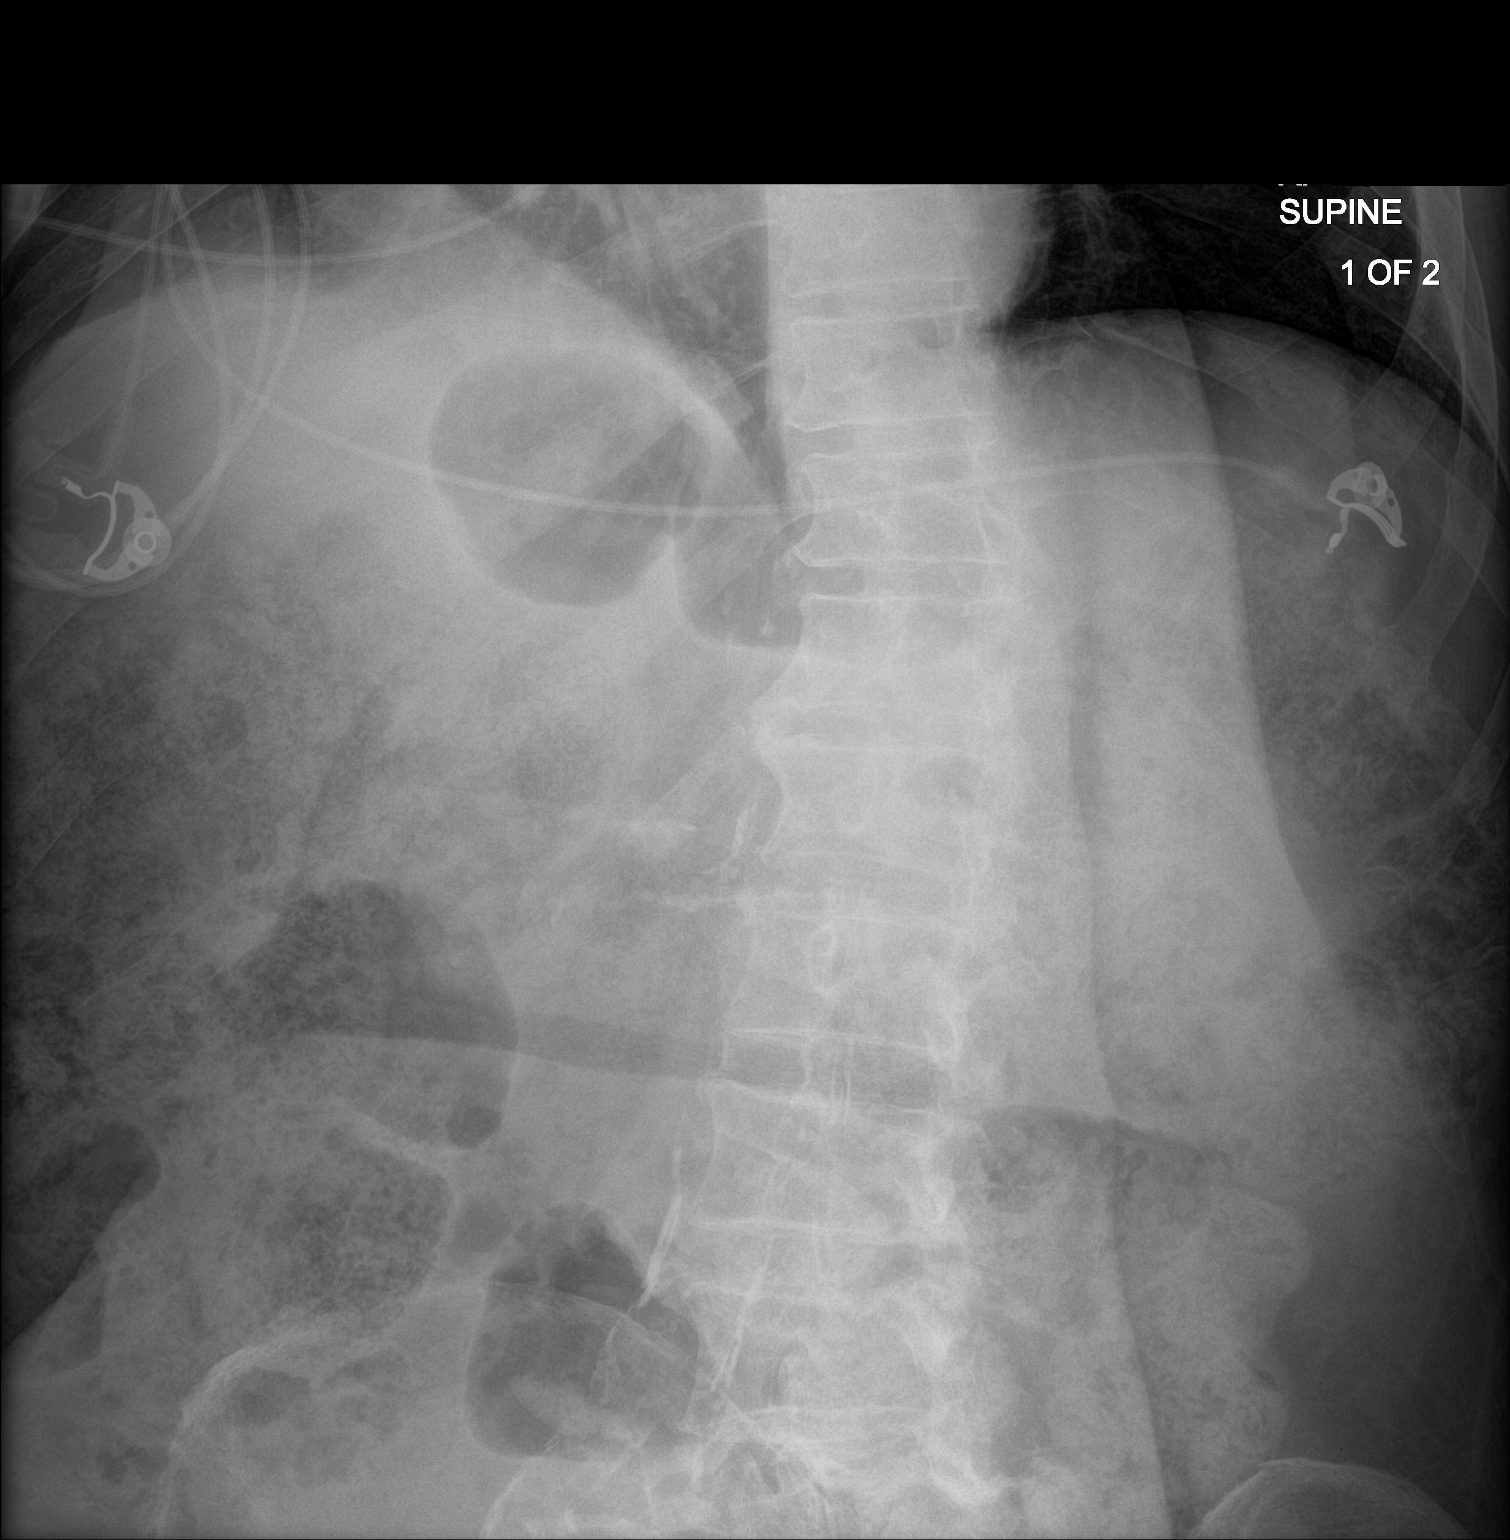

[abdomen kub (2 of 2)]
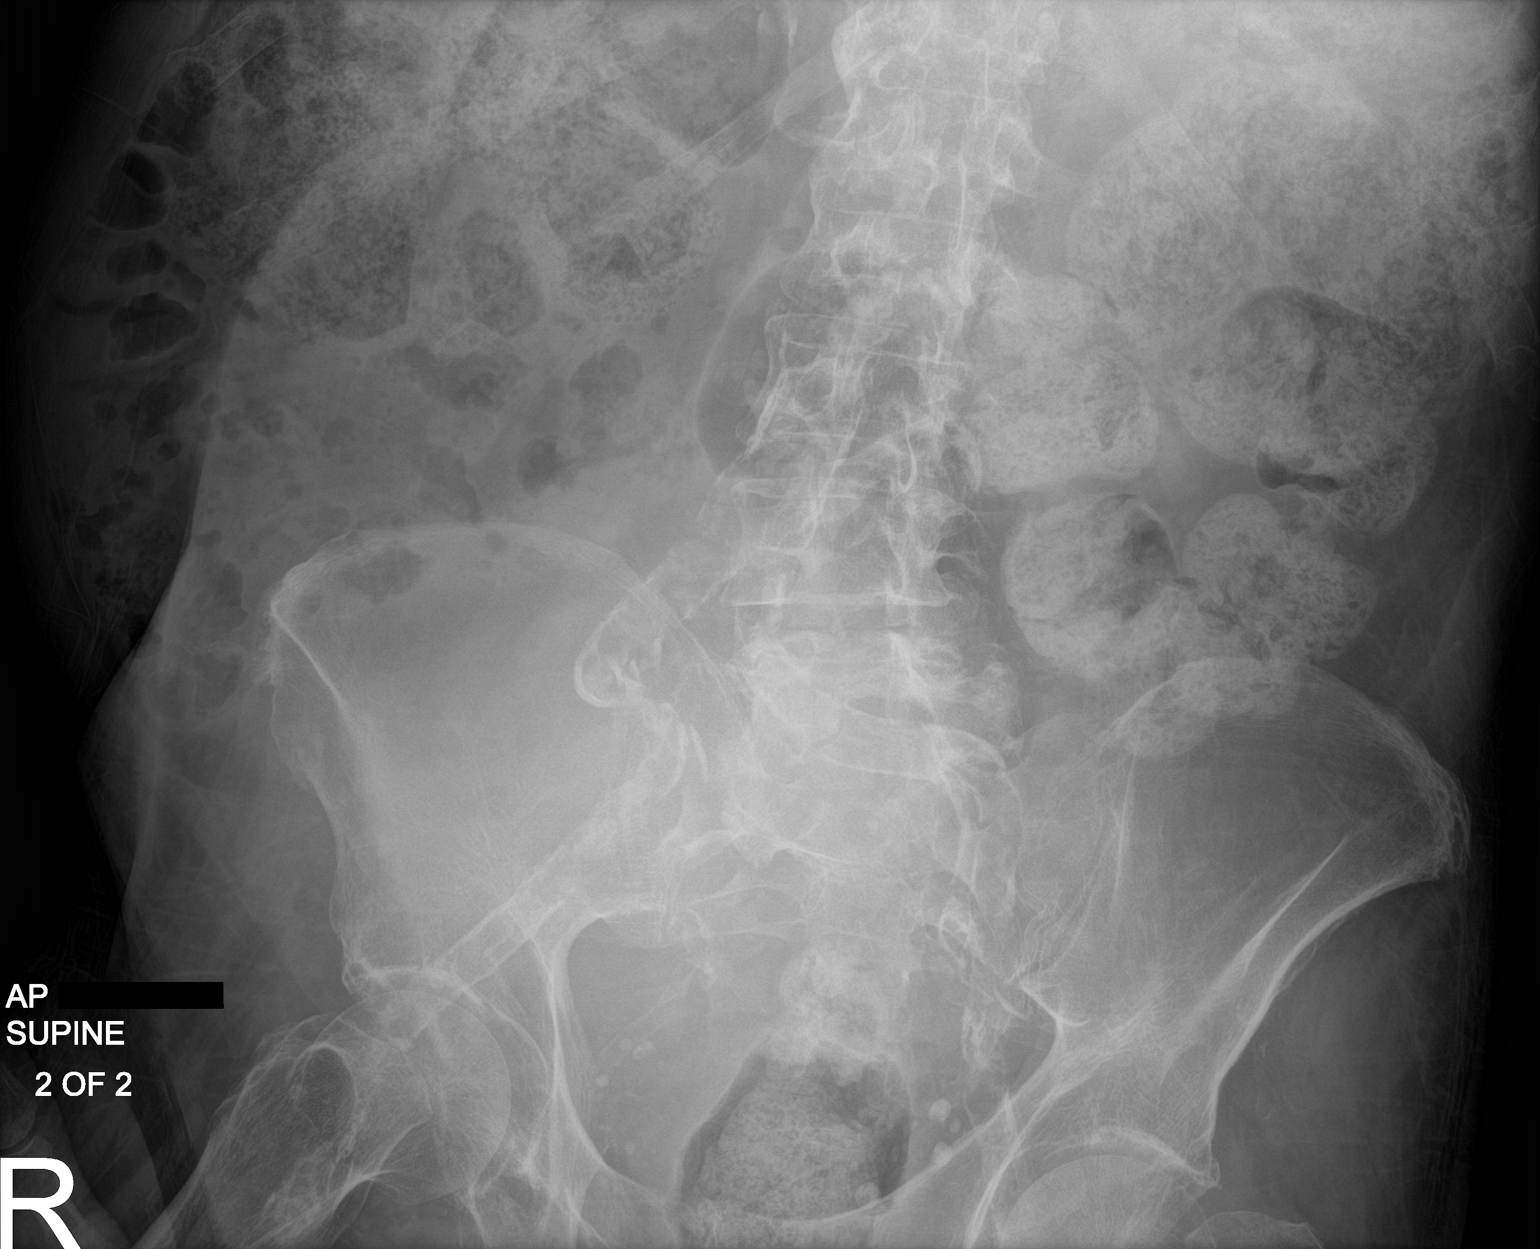

[2 of 2 positions shown; findings below may reference images not displayed]

FINDINGS: No dilated loops of bowel to indicate ileus or obstruction.
Continued large volume of colonic stool. Extensive atherosclerotic
calcification of visualized arteries.
IMPRESSION: 1. Nonobstructive bowel gas pattern.
2. Continued large colonic stool volume.

## 2023-07-20 ENCOUNTER — Emergency Department (HOSPITAL_BASED_OUTPATIENT_CLINIC_OR_DEPARTMENT_OTHER): Payer: Medicare HMO

## 2023-07-20 ENCOUNTER — Encounter (HOSPITAL_BASED_OUTPATIENT_CLINIC_OR_DEPARTMENT_OTHER): Payer: Self-pay

## 2023-07-20 ENCOUNTER — Inpatient Hospital Stay (HOSPITAL_BASED_OUTPATIENT_CLINIC_OR_DEPARTMENT_OTHER)
Admission: EM | Admit: 2023-07-20 | Discharge: 2023-07-24 | DRG: 177 | Disposition: A | Discharge status: Home Health | Payer: Medicare HMO | Attending: Internal Medicine | Admitting: Internal Medicine

## 2023-07-20 DIAGNOSIS — I69320 Aphasia following cerebral infarction: Secondary | ICD-10-CM

## 2023-07-20 DIAGNOSIS — N401 Enlarged prostate with lower urinary tract symptoms: Secondary | ICD-10-CM | POA: Diagnosis present

## 2023-07-20 DIAGNOSIS — E785 Hyperlipidemia, unspecified: Secondary | ICD-10-CM | POA: Diagnosis present

## 2023-07-20 DIAGNOSIS — I152 Hypertension secondary to endocrine disorders: Secondary | ICD-10-CM

## 2023-07-20 DIAGNOSIS — Z87891 Personal history of nicotine dependence: Secondary | ICD-10-CM

## 2023-07-20 DIAGNOSIS — Z880 Allergy status to penicillin: Secondary | ICD-10-CM

## 2023-07-20 DIAGNOSIS — Z888 Allergy status to other drugs, medicaments and biological substances status: Secondary | ICD-10-CM

## 2023-07-20 DIAGNOSIS — Z7984 Long term (current) use of oral hypoglycemic drugs: Secondary | ICD-10-CM

## 2023-07-20 DIAGNOSIS — Z7409 Other reduced mobility: Secondary | ICD-10-CM | POA: Diagnosis present

## 2023-07-20 DIAGNOSIS — R1310 Dysphagia, unspecified: Secondary | ICD-10-CM | POA: Diagnosis present

## 2023-07-20 DIAGNOSIS — N320 Bladder-neck obstruction: Secondary | ICD-10-CM | POA: Diagnosis present

## 2023-07-20 DIAGNOSIS — J690 Pneumonitis due to inhalation of food and vomit: Secondary | ICD-10-CM | POA: Diagnosis not present

## 2023-07-20 DIAGNOSIS — N138 Other obstructive and reflux uropathy: Secondary | ICD-10-CM | POA: Diagnosis present

## 2023-07-20 DIAGNOSIS — J9601 Acute respiratory failure with hypoxia: Secondary | ICD-10-CM | POA: Diagnosis present

## 2023-07-20 DIAGNOSIS — N3289 Other specified disorders of bladder: Secondary | ICD-10-CM | POA: Diagnosis present

## 2023-07-20 DIAGNOSIS — K5900 Constipation, unspecified: Secondary | ICD-10-CM | POA: Diagnosis present

## 2023-07-20 DIAGNOSIS — Z9079 Acquired absence of other genital organ(s): Secondary | ICD-10-CM

## 2023-07-20 DIAGNOSIS — E1169 Type 2 diabetes mellitus with other specified complication: Secondary | ICD-10-CM

## 2023-07-20 DIAGNOSIS — I69351 Hemiplegia and hemiparesis following cerebral infarction affecting right dominant side: Secondary | ICD-10-CM

## 2023-07-20 DIAGNOSIS — N1330 Unspecified hydronephrosis: Secondary | ICD-10-CM | POA: Diagnosis present

## 2023-07-20 DIAGNOSIS — I480 Paroxysmal atrial fibrillation: Secondary | ICD-10-CM | POA: Diagnosis present

## 2023-07-20 DIAGNOSIS — J189 Pneumonia, unspecified organism: Principal | ICD-10-CM

## 2023-07-20 DIAGNOSIS — Z79899 Other long term (current) drug therapy: Secondary | ICD-10-CM

## 2023-07-20 DIAGNOSIS — I69391 Dysphagia following cerebral infarction: Secondary | ICD-10-CM

## 2023-07-20 DIAGNOSIS — E119 Type 2 diabetes mellitus without complications: Secondary | ICD-10-CM

## 2023-07-20 DIAGNOSIS — Z603 Acculturation difficulty: Secondary | ICD-10-CM | POA: Diagnosis present

## 2023-07-20 DIAGNOSIS — R338 Other retention of urine: Secondary | ICD-10-CM | POA: Diagnosis present

## 2023-07-20 HISTORY — DX: Hypertension secondary to endocrine disorders: I15.2

## 2023-07-20 HISTORY — DX: Type 2 diabetes mellitus with other specified complication: E11.69

## 2023-07-20 LAB — URINALYSIS, ROUTINE W REFLEX MICROSCOPIC
Bilirubin Urine: NEGATIVE
Glucose, UA: NEGATIVE mg/dL
Hgb urine dipstick: NEGATIVE
Ketones, ur: NEGATIVE mg/dL
Leukocytes,Ua: NEGATIVE
Nitrite: NEGATIVE
Protein, ur: NEGATIVE mg/dL
Specific Gravity, Urine: 1.01 (ref 1.005–1.030)
pH: 6 (ref 5.0–8.0)

## 2023-07-20 LAB — COMPREHENSIVE METABOLIC PANEL
ALT: 11 U/L (ref 0–44)
AST: 17 U/L (ref 15–41)
Albumin: 3.4 g/dL — ABNORMAL LOW (ref 3.5–5.0)
Alkaline Phosphatase: 59 U/L (ref 38–126)
Anion gap: 11 (ref 5–15)
BUN: 15 mg/dL (ref 8–23)
CO2: 27 mmol/L (ref 22–32)
Calcium: 8.9 mg/dL (ref 8.9–10.3)
Chloride: 98 mmol/L (ref 98–111)
Creatinine, Ser: 1.08 mg/dL (ref 0.61–1.24)
GFR, Estimated: >60 mL/min (ref >60)
Glucose, Bld: 119 mg/dL — ABNORMAL HIGH (ref 70–99)
Potassium: 4.2 mmol/L (ref 3.5–5.1)
Sodium: 136 mmol/L (ref 135–145)
Total Bilirubin: 1 mg/dL (ref 0.3–1.2)
Total Protein: 6.9 g/dL (ref 6.5–8.1)

## 2023-07-20 LAB — CBC WITH DIFFERENTIAL/PLATELET
Abs Immature Granulocytes: 0.04 10*3/uL (ref 0.00–0.07)
Basophils Absolute: 0 10*3/uL (ref 0.0–0.1)
Basophils Relative: 0 %
Eosinophils Absolute: 0 10*3/uL (ref 0.0–0.5)
Eosinophils Relative: 0 %
HCT: 42.7 % (ref 39.0–52.0)
Hemoglobin: 14.3 g/dL (ref 13.0–17.0)
Immature Granulocytes: 0 %
Lymphocytes Relative: 21 %
Lymphs Abs: 2 10*3/uL (ref 0.7–4.0)
MCH: 31.1 pg (ref 26.0–34.0)
MCHC: 33.5 g/dL (ref 30.0–36.0)
MCV: 92.8 fL (ref 80.0–100.0)
Monocytes Absolute: 0.7 10*3/uL (ref 0.1–1.0)
Monocytes Relative: 8 %
Neutro Abs: 6.6 10*3/uL (ref 1.7–7.7)
Neutrophils Relative %: 71 %
Platelets: 152 10*3/uL (ref 150–400)
RBC: 4.6 MIL/uL (ref 4.22–5.81)
RDW: 12.7 % (ref 11.5–15.5)
WBC: 9.4 10*3/uL (ref 4.0–10.5)
nRBC: 0 % (ref 0.0–0.2)

## 2023-07-20 LAB — LIPASE, BLOOD: Lipase: 34 U/L (ref 11–51)

## 2023-07-20 MED ORDER — INSULIN ASPART 100 UNIT/ML IJ SOLN
0.0000 [IU] | Freq: Three times a day (TID) | INTRAMUSCULAR | Status: DC
  Administered 2023-07-21 – 2023-07-23 (×5): 2 [IU] via SUBCUTANEOUS
  Administered 2023-07-24: 3 [IU] via SUBCUTANEOUS

## 2023-07-20 MED ORDER — APIXABAN 5 MG PO TABS
5.0000 mg | ORAL_TABLET | Freq: Two times a day (BID) | ORAL | Status: DC
  Administered 2023-07-21 – 2023-07-24 (×8): 5 mg via ORAL
  Filled 2023-07-20 (×8): qty 1

## 2023-07-20 MED ORDER — SODIUM CHLORIDE 0.9% FLUSH
3.0000 mL | Freq: Two times a day (BID) | INTRAVENOUS | Status: DC
  Administered 2023-07-21 – 2023-07-24 (×5): 3 mL via INTRAVENOUS

## 2023-07-20 MED ORDER — ONDANSETRON HCL 4 MG/2ML IJ SOLN: 4.0000 mg | Freq: Four times a day (QID) | INTRAMUSCULAR | Status: DC | PRN

## 2023-07-20 MED ORDER — BISACODYL 10 MG RE SUPP: 10.0000 mg | Freq: Every day | RECTAL | Status: DC | PRN

## 2023-07-20 MED ORDER — SODIUM CHLORIDE 0.9 % IV SOLN
1.5000 g | Freq: Four times a day (QID) | INTRAVENOUS | Status: DC
  Administered 2023-07-21 – 2023-07-24 (×13): 1.5 g via INTRAVENOUS
  Filled 2023-07-20 (×16): qty 4

## 2023-07-20 MED ORDER — ACETAMINOPHEN 325 MG PO TABS: 650.0000 mg | ORAL_TABLET | Freq: Four times a day (QID) | ORAL | Status: DC | PRN

## 2023-07-20 MED ORDER — SODIUM CHLORIDE 0.9 % IV BOLUS
500.0000 mL | Freq: Once | INTRAVENOUS | Status: AC
  Administered 2023-07-20: 500 mL via INTRAVENOUS

## 2023-07-20 MED ORDER — IOHEXOL 300 MG/ML  SOLN
100.0000 mL | Freq: Once | INTRAMUSCULAR | Status: AC | PRN
  Administered 2023-07-20: 100 mL via INTRAVENOUS

## 2023-07-20 MED ORDER — ONDANSETRON HCL 4 MG PO TABS
4.0000 mg | ORAL_TABLET | Freq: Four times a day (QID) | ORAL | Status: DC | PRN
  Filled 2023-07-20: qty 1

## 2023-07-20 MED ORDER — ATORVASTATIN CALCIUM 40 MG PO TABS
80.0000 mg | ORAL_TABLET | Freq: Every day | ORAL | Status: DC
  Administered 2023-07-21 – 2023-07-24 (×4): 80 mg via ORAL
  Filled 2023-07-20 (×4): qty 2

## 2023-07-20 MED ORDER — LEVOFLOXACIN IN D5W 750 MG/150ML IV SOLN
750.0000 mg | Freq: Once | INTRAVENOUS | Status: AC
  Administered 2023-07-20: 750 mg via INTRAVENOUS
  Filled 2023-07-20: qty 150

## 2023-07-20 MED ORDER — TAMSULOSIN HCL 0.4 MG PO CAPS
0.4000 mg | ORAL_CAPSULE | Freq: Every day | ORAL | Status: DC
  Administered 2023-07-21 – 2023-07-24 (×4): 0.4 mg via ORAL
  Filled 2023-07-20 (×4): qty 1

## 2023-07-20 MED ORDER — SODIUM CHLORIDE 0.9 % IV SOLN: INTRAVENOUS | Status: DC | PRN

## 2023-07-20 MED ORDER — ACETAMINOPHEN 650 MG RE SUPP: 650.0000 mg | Freq: Four times a day (QID) | RECTAL | Status: DC | PRN

## 2023-07-20 MED ORDER — SENNOSIDES-DOCUSATE SODIUM 8.6-50 MG PO TABS: 1.0000 | ORAL_TABLET | Freq: Every evening | ORAL | Status: DC | PRN

## 2023-07-20 MED ORDER — METOPROLOL TARTRATE 25 MG PO TABS
25.0000 mg | ORAL_TABLET | Freq: Two times a day (BID) | ORAL | Status: DC
  Administered 2023-07-21 – 2023-07-24 (×7): 25 mg via ORAL
  Filled 2023-07-20 (×7): qty 1

## 2023-07-20 MED ORDER — ONDANSETRON HCL 4 MG/2ML IJ SOLN
4.0000 mg | Freq: Once | INTRAMUSCULAR | Status: AC
  Administered 2023-07-20: 4 mg via INTRAVENOUS
  Filled 2023-07-20: qty 2

## 2023-07-20 NOTE — ED Notes (Signed)
Pt pulled out his IV and pulled the tubing from the Levaquin spilling it all over the floor, unsure of amount received, new IV being started

## 2023-07-20 NOTE — H&P (Signed)
History and Physical    [place] GEX:[id number] DOB: [birth year] DOA: 07/20/2023  PCP: Elijio Miles., MD  Patient coming from: Home  I have personally briefly reviewed patient's old medical records in [facility]  Chief Complaint: Nausea, vomiting, shortness of breath  HPI: Dylan Park is a 72 y.o. male with medical history significant for history of CVA (with residual right hemiparesis, confusion, poor verbal baseline), PAF on Eliquis, T2DM, HTN, HLD, BPH who presented to the ED for evaluation of nausea, vomiting, and shortness of breath.  History is limited from patient due to limited verbal communication and is otherwise supplemented by EDP and chart review.  Patient brought to ED by EMS for 2-3 days of frequent nausea and vomiting.  Last night developed shortness of breath.  Patient has limited verbal communication at baseline due to prior CVA but appears at his baseline at time of admission.  He is answering questions appropriately in yes/no manner and following commands.  He acknowledges recent nausea, vomiting, shortness of breath.  Denies cough or chest pain.  He cannot elaborate on if he has issues with difficulty swallowing or dietary restriction.  [facility] ED Course  Labs/Imaging on admission: I have personally reviewed following labs and imaging studies.  Initial vitals showed BP 146/98, pulse 93, RR 20, temp 98.9 F, SpO2 94% on 2 L O2 via [place].  Per EDP SpO2 dropped was as low as 86% on room air.  Labs showed WBC 9.4, hemoglobin 14.3, platelets 152,000, sodium 136, potassium 4.2, bicarb 27, BUN 15, creatinine 1.08, serum glucose 119, LFTs within normal limits, lipase 34, UA negative for UTI.  CT head without contrast negative for acute intracranial findings.  Atrophy and multiple old infarcts noted.  CT abdomen/pelvis with contrast negative for intestinal obstruction or pneumoperitoneum.  Appendix not dilated.  Dilation of collecting systems and ureters  on both sides noted with marked distention of the urinary bladder.  No demonstratable opaque renal or ureteral stones noted.  Small patchy infiltrates in the posterior lower lung fields, more so on the left side seen.  2 view chest x-ray showed cardiomegaly with increased markings in the posterior left lower lung.  Patient was given 500 cc normal saline and IV Levaquin.  The hospitalist service was consulted to admit for further evaluation and management.  Review of Systems: All systems reviewed and are negative except as documented in history of present illness above.   Past Medical History:  Diagnosis Date   Diabetes mellitus without complication (HCC)    Hemiplegia and hemiparesis following cerebral infarction affecting right dominant side (HCC) 03/13/2021   03/21/21 D/C summary- He is admitted with a right leg weakness secondary to   acute left frontal lobe stroke.  Added automatically from request for surgery [id number]     Hyperlipidemia associated with type 2 diabetes mellitus (HCC) 07/20/2023   Hypertension associated with diabetes (HCC) 07/20/2023   Paroxysmal atrial fibrillation (HCC) 12/28/2019   Stroke [facility])     History reviewed. No pertinent surgical history.  Social History:  reports that he has quit smoking. His smoking use included cigarettes. He has never used smokeless tobacco. He reports that he does not drink alcohol and does not use drugs.  Allergies  Allergen Reactions   Lisinopril Other (See Comments)    Low heart rate and BP   Penicillins Rash    History reviewed. No pertinent family history.   Prior to Admission medications   Medication Sig Start Date  End Date Taking? Authorizing Provider  INSULIN ASPART [place] Inject into the skin.    [provider]  metFORMIN (GLUCOPHAGE) 500 MG tablet Take 500 mg by mouth 2 (two) times daily. 06/29/21   [provider]  metoprolol succinate (TOPROL-XL) 200 MG 24 hr tablet Take 1 tablet (200 mg total) by  mouth daily. Take with or immediately following a meal. 07/23/21 08/06/22  Hughie Closs, MD  rosuvastatin (CRESTOR) 40 MG tablet Take 40 mg by mouth daily. 06/29/21   [provider]    Physical Exam: Vitals:   07/20/23 1713 07/20/23 1730 07/20/23 1930 07/20/23 2204  BP:  (!) 164/107 (!) 159/96 128/86  Pulse:  94 (!) 103 95  Resp:  18 17 17   Temp: 98.3 F (36.8 C)   99.6 F (37.6 C)  TempSrc: Oral   Oral  SpO2:  97% 98% 97%  Weight:      Height:       Constitutional: Chronically ill-appearing man resting in bed with head elevated.  NAD, calm. Eyes: EOMI, lids and conjunctivae normal ENMT: Mucous membranes are moist. Posterior pharynx clear of any exudate or lesions.Normal dentition.  Neck: normal, supple, no masses. Respiratory: clear to auscultation anteriorly. Normal respiratory effort. No accessory muscle use.  Cardiovascular: Regular rate and rhythm, no murmurs / rubs / gallops. No extremity edema. 2+ pedal pulses. Abdomen: no tenderness, no masses palpated. GU: Foley catheter in place Musculoskeletal: Right hemiparesis Skin: no rashes Neurologic: Limited verbal communication, speech is clear when he does speak.  Sensation intact. Strength 5/5 LUE and LLE.  Strength 1/5 RUE and 0/5 RLE. Psychiatric: Alert and oriented x 3.  EKG: Not performed.  Assessment/Plan Principal Problem:   Acute respiratory failure with hypoxia (HCC) Active Problems:   Aspiration pneumonia of left lower lobe (HCC)   Paroxysmal atrial fibrillation (HCC)   Hemiplegia and hemiparesis following cerebral infarction affecting right dominant side (HCC)   Type 2 diabetes mellitus (HCC)   Hypertension associated with diabetes (HCC)   Hyperlipidemia associated with type 2 diabetes mellitus (HCC)   Bladder outlet obstruction   Dylan Park is a 72 y.o. male with medical history significant for history of CVA (with residual right hemiparesis, confusion, poor verbal baseline), PAF on Eliquis, T2DM,  HTN, HLD, BPH who is admitted with acute hypoxic respiratory failure due to left lower lobe aspiration pneumonia.  Assessment and Plan: Acute respiratory failure with hypoxia due to left lower lobe pneumonia: Suspected aspiration pneumonia with recent emesis.  SpO2 reportedly down to 86% on RA in the ED, currently stable on 2 L O2 via [place].  Not on supplemental O2 at baseline. -Start IV Unasyn (penicillin allergy listed however he was treated with ampicillin and Augmentin last month at OSH for VRE UTI) -N.p.o. tonight, aspiration precautions, SLP eval in a.m.  Paroxysmal atrial fibrillation: Rate controlled.  Continue Lopressor and Eliquis.  History of CVA with residual right hemiparesis: Appears to be at baseline.  Continue Eliquis and atorvastatin.  Type 2 diabetes: Stable.  A1c 6.4% 06/03/2023.  Placed on SSI.  Hypertension: Continue Lopressor.  Hyperlipidemia: Continue atorvastatin.  BPH with bladder outlet obstruction and urinary retention: CT showed distended bladder with dilated bilateral collecting systems and ureters.  Foley catheter was placed in the ED with 2 L UOP.  Continue Flomax.   DVT prophylaxis:  apixaban (ELIQUIS) tablet 5 mg   Code Status: Full code Family Communication: None present on admission Disposition Plan: Pending clinical progress Consults called: None Severity of  Illness: The appropriate patient status for this patient is OBSERVATION. Observation status is judged to be reasonable and necessary in order to provide the required intensity of service to ensure the patient's safety. The patient's presenting symptoms, physical exam findings, and initial radiographic and laboratory data in the context of their medical condition is felt to place them at decreased risk for further clinical deterioration. Furthermore, it is anticipated that the patient will be medically stable for discharge from the hospital within 2 midnights of admission.   Darreld Mclean MD [facility]  If 7PM-7AM, please contact night-coverage [email or web address]  07/20/2023, 10:46 PM

## 2023-07-20 NOTE — ED Notes (Signed)
Called [facility] for transport at 7:37 pm and spoke to [place].

## 2023-07-20 NOTE — ED Triage Notes (Addendum)
BIB EMS C/o N/V/D for the past 3 days, able to tolerate oral fluids. Hx of right sided deficits and confusion from previous CVA.  Spanish speaking.  Pt not answering questions when asked by interpreter. SpO2 89-92% on RA, placed on 2L O2 via [place]. PA at bedside

## 2023-07-20 NOTE — ED Notes (Signed)
Attempted to call report, told RN not available, requested for this RN to c/b in 10 min

## 2023-07-20 NOTE — Progress Notes (Signed)
Pt arrived to unit. Vitals stable. Notified provider.  

## 2023-07-20 NOTE — ED Provider Notes (Incomplete)
72 year old male history of CHF, A-fib on Eliquis, CVA with prior right-sided deficits, type 2 diabetes, hypertension, hyperlipidemia presenting for reported vomiting and diarrhea.  Unfortunately no family is at bedside for collateral history.  Patient is able to confirm his name and say yes and no, he denies any pain or difficulty breathing.  Specifically denies headache, chest pain, difficulty breathing.  Does say yes when asked about belly pain.  On exam he has good strength in the left upper and left lower extremity foot does not move antigravity in the right.  He tracks examiner, and is awake and alert.  Cranial nerves are intact although follow commands is somewhat limited.  Based on chart review seems to be close to his baseline but will try to obtain collateral.  He is afebrile and hemodynamically stable.  Plan for broad workup for evaluation of vomiting and diarrhea, could be gastroenteritis plus consider possible intracranial hemorrhage given anticoagulation and vomiting, infectious cause, UTI, metabolic abnormality.  The PA was able to contact patient's wife.  Reports patient has had multiple days of vomiting but no other symptoms.  He is at his neurologic baseline per family.

## 2023-07-20 NOTE — ED Provider Notes (Signed)
[facility] Provider Note   CSN: [id number] Arrival date & time: 07/20/23  1238    History  Chief Complaint  Patient presents with   Vomiting   Level 5 Caveat: patient non-verbal  Dylan Park is a 72 y.o. male with a past medical history of CVA (right-sided deficit), DM, who presents to the emergency department brought in by EMS with concerns for vomiting x 3 days.  Has associated nausea, diarrhea.  Notes that he has been able to tolerate fluids at home.  Denies sick contacts. Denies abdominal pain, constipation, chest pain, shortness of breath.   The history is provided by the patient and the EMS personnel. A language interpreter was used (spanish).       Home Medications Prior to Admission medications   Medication Sig Start Date End Date Taking? Authorizing Provider  INSULIN ASPART [place] Inject into the skin.    [provider]  metFORMIN (GLUCOPHAGE) 500 MG tablet Take 500 mg by mouth 2 (two) times daily. 06/29/21   [provider]  metoprolol succinate (TOPROL-XL) 200 MG 24 hr tablet Take 1 tablet (200 mg total) by mouth daily. Take with or immediately following a meal. 07/23/21 08/06/22  Hughie Closs, MD  rosuvastatin (CRESTOR) 40 MG tablet Take 40 mg by mouth daily. 06/29/21   [provider]      Allergies    Lisinopril and Penicillins    Review of Systems   Review of Systems  Unable to perform ROS: Patient nonverbal  All other systems reviewed and are negative.   Physical Exam Updated Vital Signs BP (!) 146/98   Pulse 93   Temp 98.9 F (37.2 C) (Oral)   Resp 20   Ht 5\' 10"  (1.778 m)   Wt 85.4 kg   SpO2 94%   BMI 27.00 kg/m  Physical Exam Vitals and nursing note reviewed.  Constitutional:      General: He is not in acute distress.    Appearance: He is not diaphoretic.     Comments: Patient responsive with yes and no responses and only answers some questions (according to EMS report from  family that this is at baseline per patient and patient speaks when he feels like it)  HENT:     Head: Normocephalic and atraumatic.     Mouth/Throat:     Pharynx: No oropharyngeal exudate.  Eyes:     General: No scleral icterus.    Conjunctiva/sclera: Conjunctivae normal.  Cardiovascular:     Rate and Rhythm: Normal rate and regular rhythm.     Pulses: Normal pulses.     Heart sounds: Normal heart sounds.  Pulmonary:     Effort: Pulmonary effort is normal. No respiratory distress.     Breath sounds: Normal breath sounds. No wheezing.  Abdominal:     General: Bowel sounds are normal.     Palpations: Abdomen is soft. There is no mass.     Tenderness: There is generalized abdominal tenderness. There is no guarding or rebound.     Comments: Diffuse abdominal TTP.   Musculoskeletal:        General: Normal range of motion.     Cervical back: Normal range of motion and neck supple.  Skin:    General: Skin is warm and dry.  Neurological:     Mental Status: He is alert.  Psychiatric:        Behavior: Behavior normal.     ED Results / Procedures /  Treatments   Labs (all labs ordered are listed, but only abnormal results are displayed) Labs Reviewed  COMPREHENSIVE METABOLIC PANEL - Abnormal; Notable for the following components:      Result Value   Glucose, Bld 119 (*)    Albumin 3.4 (*)    All other components within normal limits  CBC WITH DIFFERENTIAL/PLATELET  LIPASE, BLOOD  URINALYSIS, ROUTINE W REFLEX MICROSCOPIC    EKG None  Radiology DG Chest 2 View  Result Date: 07/20/2023 CLINICAL DATA:  Hypoxia, nausea, vomiting EXAM: CHEST - 2 VIEW COMPARISON:  06/01/2023 FINDINGS: Transverse diameter of heart is increased. Thoracic aorta is tortuous and ectatic. Subtle increase in markings are seen in the posterior left lower lung field. There is no pleural effusion or pneumothorax. Arterial calcifications are seen. IMPRESSION: Cardiomegaly. There are no signs of pulmonary  edema. Subtle increased markings in left lower lung field may suggest atelectasis/pneumonia. Electronically Signed   By: Ernie Avena M.D.   On: 07/20/2023 15:53   CT ABDOMEN PELVIS W CONTRAST  Result Date: 07/20/2023 CLINICAL DATA:  Abdominal pain, nausea, vomiting EXAM: CT ABDOMEN AND PELVIS WITH CONTRAST TECHNIQUE: Multidetector CT imaging of the abdomen and pelvis was performed using the standard protocol following bolus administration of intravenous contrast. RADIATION DOSE REDUCTION: This exam was performed according to the departmental dose-optimization program which includes automated exposure control, adjustment of the mA and/or kV according to patient size and/or use of iterative reconstruction technique. CONTRAST:  OMNIPAQUE IOHEXOL 300 MG/ML  SOLN COMPARISON:  04/21/2023 FINDINGS: Lower chest: There are patchy infiltrates in the posterior lower lung fields, more so on the left side Coronary artery calcifications are seen. Hepatobiliary: No focal abnormalities are seen in liver. There is no dilation of bile ducts. Gallbladder is not distended. Pancreas: No focal abnormalities are seen. Spleen: Unremarkable. Adrenals/Urinary Tract: There are nodular densities in the adrenals with no significant change. There is prominence of the pelvocaliceal systems and ureters on both sides. There is marked distention of the urinary bladder measuring 22 cm in maximum length. There is no wall thickening in the bladder. There is suggestion of trans urethral resection of prostate. Stomach/Bowel: Stomach is unremarkable. Small bowel loops are not dilated. Appendix is not dilated. There is no significant wall thickening in colon. There is no pericolic stranding. Moderate amount of stool is seen in colon and rectum. Vascular/Lymphatic: Calcifications are seen in aorta and its major branches. Reproductive: There is possible previous trans urethral resection of prostate. Prostatic urethra appears prominent.  Other: There is no ascites or pneumoperitoneum. Right inguinal hernia containing fat is seen. There is fluid in the left inguinal region. Umbilical hernia containing fat is seen. Musculoskeletal: No acute findings are seen. IMPRESSION: There is no evidence of intestinal obstruction or pneumoperitoneum. Appendix is not dilated. There is dilation of collecting systems and ureters on both sides. There is marked distention of urinary bladder. Possibility of bladder outlet obstruction causing bilateral hydronephrosis should be considered. There are no demonstrable opaque renal or ureteral stones. There are small patchy infiltrates in the posterior lower lung fields, more so on the left side suggesting atelectasis/pneumonia. Other findings as described in the body of the report. Electronically Signed   By: Ernie Avena M.D.   On: 07/20/2023 15:51   CT Head Wo Contrast  Result Date: 07/20/2023 CLINICAL DATA:  Altered mental status EXAM: CT HEAD WITHOUT CONTRAST TECHNIQUE: Contiguous axial images were obtained from the base of the skull through the vertex without intravenous contrast. RADIATION  DOSE REDUCTION: This exam was performed according to the departmental dose-optimization program which includes automated exposure control, adjustment of the mA and/or kV according to patient size and/or use of iterative reconstruction technique. COMPARISON:  06/01/2023 FINDINGS: Brain: No acute intracranial findings are seen. There are no signs of bleeding within the cranium. Cortical sulci are prominent. Old infarcts are seen in right parieto-occipital region, left frontal region, left periventricular region and right pons suggesting multiple infarcts. No interval changes are noted. Vascular: Scattered arterial calcifications are seen. Skull: No acute findings are seen. Sinuses/Orbits: Unremarkable. Other: No significant interval changes are noted. IMPRESSION: No acute intracranial findings are seen. Atrophy. Multiple old  infarcts as described in the body of the report with no significant change. Electronically Signed   By: Ernie Avena M.D.   On: 07/20/2023 15:36    Procedures Procedures    Medications Ordered in ED Medications  0.9 %  sodium chloride infusion (0 mLs Intravenous Stopped 07/20/23 1938)  sodium chloride 0.9 % bolus 500 mL (0 mLs Intravenous Stopped 07/20/23 1712)  ondansetron (ZOFRAN) injection 4 mg (4 mg Intravenous Given 07/20/23 1406)  iohexol (OMNIPAQUE) 300 MG/ML solution 100 mL (100 mLs Intravenous Contrast Given 07/20/23 1449)  levofloxacin (LEVAQUIN) IVPB 750 mg (0 mg Intravenous Stopped 07/20/23 1935)    ED Course/ Medical Decision Making/ A&P Clinical Course as of 07/20/23 2058  Mon Jul 20, 2023  1249 Attempted to call spouse Breydan [place]illing[place]), no answer at this time. [SB]  1415 Attempted to call spouse Braxston Quinter), no answer at this time. HIPAA compliant voicemail left at this time.  [SB]  1436 Call with spouse Dennard Nip with interpreter on the [place], [place] #[id number].  Wife notes that patient has a history of 3 CVAs, patient does not speak much and has had memory loss as well following his CVAs.  He does not wear oxygen at home however wife noted that he was having breathing issues last night due to the [facility] not working.  Wife notes that patient has had 3 days of nausea, vomiting, diarrhea.  No sick contacts.  No meds tried at home.  Wife notes that she is on the way to the emergency department as well. [SB]  1657 Pt re-evaluated and discussed plans for antibiotics. Pt agreeable at this time.  [SB]  1703 Oxygen decreased to 86% in room. Patient placed back on 2L via [place] [SB]  1904 Consult with hospitalist, Dr. Allena Katz who agrees with admission at this time.  [SB]  2027 Call to patient spouse and informed of patient admission to hospital. Interpreter on the line. Also instructed patient spouse of patient room number and where to go upon arrival to [facility] hospital. Patient spouse  appreciative at this time.  [SB]    Clinical Course User Index [SB] Saurabh Hettich A, PA-C                             Medical Decision Making Amount and/or Complexity of Data Reviewed Labs: ordered. Radiology: ordered.  Risk Prescription drug management. Decision regarding hospitalization.   Patient presents to the emergency department with concerns for nausea, vomiting, diarrhea x 3 days.  Patient initially hypoxic at 89%, placed on 2 L of oxygen via nasal cannula.  Patient afebrile.  On exam patient with diffuse abdominal tenderness to palpation.  Remainder of exam without acute findings.  Differential diagnosis includes viral etiology, diverticulitis, pancreatitis, cholecystitis, appendicitis, acute cystitis, PNA.  Additional history  obtained:  Additional history obtained from EMS External records from outside source obtained and reviewed including: Pt was admitted to the hospital on 06/03/23.   Labs:  I ordered, and personally interpreted labs.  The pertinent results include:  Lipase unremarkable CBC unremarkable CMP unremarkable Urinalysis unremarkable  Imaging: I ordered imaging studies including Chest x-ray CT head without CT abdomen pelvis with  I independently visualized and interpreted imaging which showed CT head with  No acute intracranial findings are seen. Atrophy. Multiple old  infarcts as described in the body of the report with no significant  change.   CT AP  There is no evidence of intestinal obstruction or pneumoperitoneum.  Appendix is not dilated.    There is dilation of collecting systems and ureters on both sides.  There is marked distention of urinary bladder. Possibility of  bladder outlet obstruction causing bilateral hydronephrosis should  be considered. There are no demonstrable opaque renal or ureteral  stones.    There are small patchy infiltrates in the posterior lower lung  fields, more so on the left side suggesting  atelectasis/pneumonia.    Other findings as described in the body of the report.   CXR with  Cardiomegaly. There are no signs of pulmonary edema. Subtle  increased markings in left lower lung field may suggest  atelectasis/pneumonia.   I agree with the radiologist interpretation  Medications:  I ordered medication including IVF, zofran, levaquin for symptom management. I have reviewed the patients home medicines and have made adjustments as needed   Consultations: I requested consultation with the Hospitalist, Dr. Allena Katz and discussed lab and imaging findings as well as pertinent plan - they recommend: agrees with admission   Disposition: Presentation suspicious for pneumonia. Doubt concerns at this time for diverticulitis, appendicitis, pancreatitis, cholecystitis, acute cystitis. After consideration of the diagnostic results and the patients response to treatment, I feel that the patient would benefit from Admission to the hospital. Discussed with patient and wife regarding plans for plans for admission. See ED course for conversation with patient wife regarding admission.  Pt appears safe for admission at this time.   This chart was dictated using voice recognition software, Dragon. Despite the best efforts of this provider to proofread and correct errors, errors may still occur which can change documentation meaning.   Final Clinical Impression(s) / ED Diagnoses Final diagnoses:  Community acquired pneumonia of left lower lobe of lung  Acute respiratory failure with hypoxia [facility])    Rx / DC Orders ED Discharge Orders     None         Kofi Murrell A, PA-C 07/20/23 2058    Laurence Spates, MD 07/21/23 [phone]

## 2023-07-20 NOTE — Hospital Course (Signed)
Dylan Park is a 72 y.o. male with medical history significant for history of CVA (with residual right hemiparesis, confusion, poor verbal baseline), PAF on Eliquis, T2DM, HTN, HLD, BPH who is admitted with acute hypoxic respiratory failure due to left lower lobe aspiration pneumonia. He was also found to have difficulty with urination leading to bladder outlet obstruction and bilateral hydronephrosis.  Moderate stool burden also appreciated on imaging.  Foley catheter was placed on admission and he was started on a bowel regimen.

## 2023-07-20 NOTE — Progress Notes (Signed)
Plan of Care Note for accepted transfer   Patient: Dylan Park MRN: [id number]   DOA: 07/20/2023  Facility requesting transfer: [facility] Requesting Provider: Blue,Soijett A, PA-C Reason for transfer: Acute hypoxic respiratory failure due to pneumonia Facility course:  72 y/o Spanish-speaking male with history of CVA (with residual right hemiparesis, confusion, poor verbal baseline), PAF on Eliquis, T2DM, HTN, HLD who presented to the ED for reported 3 days nausea, vomiting, diarrhea.  Last night began having difficulty with breathing.  Per report, patient's baseline speech is essentially limited to yes/no answers.  Per EDP SpO2 also was 86% on room air, improved on 2 L via [place].  Imaging concerning for LLL pneumonia.  CT abdomen/pelvis shows distended bladder and bilateral ureteral dilation, possibility of bladder outlet obstruction.  Patient was given Levaquin and 500 cc NS.  Admission requested for acute hypoxic respiratory failure due to pneumonia.    Plan of care: The patient is accepted for admission to Telemetry unit, at [facility] or [facility], first available.  Author: Darreld Mclean, MD 07/20/2023  Check [email or web address] for on-call coverage.  Nursing staff, Please call [facility] Admits & Consults System-Wide number on Amion as soon as patient's arrival, so appropriate admitting provider can evaluate the pt.

## 2023-07-20 NOTE — ED Notes (Signed)
Report called to Meriel Pica, Therapist, sports.

## 2023-07-20 NOTE — ED Notes (Signed)
[facility] on the floor, report and paperwork given, pt had no belongings

## 2023-07-21 DIAGNOSIS — K5900 Constipation, unspecified: Secondary | ICD-10-CM | POA: Diagnosis present

## 2023-07-21 DIAGNOSIS — Z603 Acculturation difficulty: Secondary | ICD-10-CM | POA: Diagnosis present

## 2023-07-21 DIAGNOSIS — J690 Pneumonitis due to inhalation of food and vomit: Secondary | ICD-10-CM | POA: Diagnosis present

## 2023-07-21 DIAGNOSIS — E1169 Type 2 diabetes mellitus with other specified complication: Secondary | ICD-10-CM | POA: Diagnosis present

## 2023-07-21 DIAGNOSIS — N320 Bladder-neck obstruction: Secondary | ICD-10-CM | POA: Diagnosis present

## 2023-07-21 DIAGNOSIS — I69391 Dysphagia following cerebral infarction: Secondary | ICD-10-CM | POA: Diagnosis not present

## 2023-07-21 DIAGNOSIS — N138 Other obstructive and reflux uropathy: Secondary | ICD-10-CM | POA: Diagnosis present

## 2023-07-21 DIAGNOSIS — Z7984 Long term (current) use of oral hypoglycemic drugs: Secondary | ICD-10-CM | POA: Diagnosis not present

## 2023-07-21 DIAGNOSIS — J9601 Acute respiratory failure with hypoxia: Secondary | ICD-10-CM | POA: Diagnosis present

## 2023-07-21 DIAGNOSIS — Z888 Allergy status to other drugs, medicaments and biological substances status: Secondary | ICD-10-CM | POA: Diagnosis not present

## 2023-07-21 DIAGNOSIS — N3289 Other specified disorders of bladder: Secondary | ICD-10-CM | POA: Diagnosis present

## 2023-07-21 DIAGNOSIS — Z79899 Other long term (current) drug therapy: Secondary | ICD-10-CM | POA: Diagnosis not present

## 2023-07-21 DIAGNOSIS — I69351 Hemiplegia and hemiparesis following cerebral infarction affecting right dominant side: Secondary | ICD-10-CM | POA: Diagnosis not present

## 2023-07-21 DIAGNOSIS — I480 Paroxysmal atrial fibrillation: Secondary | ICD-10-CM | POA: Diagnosis present

## 2023-07-21 DIAGNOSIS — R1310 Dysphagia, unspecified: Secondary | ICD-10-CM | POA: Diagnosis present

## 2023-07-21 DIAGNOSIS — Z9079 Acquired absence of other genital organ(s): Secondary | ICD-10-CM | POA: Diagnosis not present

## 2023-07-21 DIAGNOSIS — I152 Hypertension secondary to endocrine disorders: Secondary | ICD-10-CM | POA: Diagnosis present

## 2023-07-21 DIAGNOSIS — I69320 Aphasia following cerebral infarction: Secondary | ICD-10-CM | POA: Diagnosis not present

## 2023-07-21 DIAGNOSIS — Z87891 Personal history of nicotine dependence: Secondary | ICD-10-CM | POA: Diagnosis not present

## 2023-07-21 DIAGNOSIS — N401 Enlarged prostate with lower urinary tract symptoms: Secondary | ICD-10-CM | POA: Diagnosis present

## 2023-07-21 DIAGNOSIS — R338 Other retention of urine: Secondary | ICD-10-CM | POA: Diagnosis present

## 2023-07-21 DIAGNOSIS — E785 Hyperlipidemia, unspecified: Secondary | ICD-10-CM | POA: Diagnosis present

## 2023-07-21 DIAGNOSIS — Z880 Allergy status to penicillin: Secondary | ICD-10-CM | POA: Diagnosis not present

## 2023-07-21 DIAGNOSIS — N1330 Unspecified hydronephrosis: Secondary | ICD-10-CM | POA: Diagnosis present

## 2023-07-21 LAB — CBC
HCT: 38.6 % — ABNORMAL LOW (ref 39.0–52.0)
Hemoglobin: 12.9 g/dL — ABNORMAL LOW (ref 13.0–17.0)
MCH: 31.6 pg (ref 26.0–34.0)
MCHC: 33.4 g/dL (ref 30.0–36.0)
MCV: 94.6 fL (ref 80.0–100.0)
Platelets: 144 10*3/uL — ABNORMAL LOW (ref 150–400)
RBC: 4.08 MIL/uL — ABNORMAL LOW (ref 4.22–5.81)
RDW: 12.5 % (ref 11.5–15.5)
WBC: 7.8 10*3/uL (ref 4.0–10.5)
nRBC: 0 % (ref 0.0–0.2)

## 2023-07-21 LAB — BASIC METABOLIC PANEL
Anion gap: 10 (ref 5–15)
BUN: 13 mg/dL (ref 8–23)
CO2: 24 mmol/L (ref 22–32)
Calcium: 8.7 mg/dL — ABNORMAL LOW (ref 8.9–10.3)
Chloride: 104 mmol/L (ref 98–111)
Creatinine, Ser: 0.93 mg/dL (ref 0.61–1.24)
GFR, Estimated: >60 mL/min (ref >60)
Glucose, Bld: 121 mg/dL — ABNORMAL HIGH (ref 70–99)
Potassium: 3.7 mmol/L (ref 3.5–5.1)
Sodium: 138 mmol/L (ref 135–145)

## 2023-07-21 LAB — GLUCOSE, CAPILLARY
Glucose-Capillary: 112 mg/dL — ABNORMAL HIGH (ref 70–99)
Glucose-Capillary: 163 mg/dL — ABNORMAL HIGH (ref 70–99)
Glucose-Capillary: 163 mg/dL — ABNORMAL HIGH (ref 70–99)
Glucose-Capillary: 108 mg/dL — ABNORMAL HIGH (ref 70–99)

## 2023-07-21 LAB — HEMOGLOBIN AND HEMATOCRIT, BLOOD
HCT: 41.4 % (ref 39.0–52.0)
Hemoglobin: 13.6 g/dL (ref 13.0–17.0)

## 2023-07-21 MED ORDER — SENNOSIDES-DOCUSATE SODIUM 8.6-50 MG PO TABS
1.0000 | ORAL_TABLET | Freq: Two times a day (BID) | ORAL | Status: DC
  Administered 2023-07-21 – 2023-07-24 (×6): 1 via ORAL
  Filled 2023-07-21 (×7): qty 1

## 2023-07-21 MED ORDER — POLYETHYLENE GLYCOL 3350 17 G PO PACK
17.0000 g | PACK | Freq: Every day | ORAL | Status: DC
  Administered 2023-07-22 – 2023-07-24 (×3): 17 g via ORAL
  Filled 2023-07-21 (×4): qty 1

## 2023-07-21 NOTE — TOC Initial Note (Addendum)
Transition of Care [facility]) - Initial/Assessment Note    Patient Details  Name: Dylan Park MRN: [id number] Date of Birth: 03-23-1951  Transition of Care [facility]) CM/SW Contact:    Howell Rucks, RN Phone Number: 07/21/2023, 2:22 PM  Clinical Narrative:   Met with pt and pt's wife at bedside to introduce role of TOC/NCM and review for dc planning via [employer], broadcasting/film/video. Spouse confirmed pt has a PCP and pharmacy in place, reports pt is non ambulatory secondary to hx of CVA's, reports pt receiving home care services through [facility], NCM unable to verify what services pt is receiving, wife reports she cares for patient and could use some assistance. NCM will research resources to assist pt's wife at home. TOC will continue to follow.       -3:46pm Text received from Ken Caryl with [facility], reports pt receiving HH PT/OT, reports it takes two skilled clinicians to get patient out of the bed, recommend short term rehab, will request PT eval from attending.     Expected Discharge Plan: Home/Self Care Barriers to Discharge: Continued Medical Work up   Patient Goals and CMS Choice Patient states their goals for this hospitalization and ongoing recovery are:: Home per pt's spouse          Expected Discharge Plan and Services       Living arrangements for the past 2 months: Single Family Home                                      Prior Living Arrangements/Services Living arrangements for the past 2 months: Single Family Home Lives with:: Spouse Patient language and need for interpreter reviewed:: Yes (pt with aphasia secondary to hx of CVA) Do you feel safe going back to the place where you live?: Yes      Need for Family Participation in Patient Care: Yes (Comment) Care giver support system in place?: Yes (comment)   Criminal Activity/Legal Involvement Pertinent to Current Situation/Hospitalization: No - Comment as needed  Activities of Daily Living       Permission Sought/Granted Permission sought to share information with : Case Manager Permission granted to share information with : Yes, Verbal Permission Granted  Share Information with NAME: Fannie Knee, RN           Emotional Assessment Appearance:: Appears stated age Attitude/Demeanor/Rapport: Gracious Affect (typically observed): Accepting Orientation: : Oriented to Self, Oriented to Place, Oriented to  Time, Oriented to Situation Alcohol / Substance Use: Not Applicable Psych Involvement: No (comment)  Admission diagnosis:  Acute respiratory failure with hypoxia (HCC) [J96.01] Community acquired pneumonia of left lower lobe of lung [J18.9] Acute hypoxemic respiratory failure (HCC) [J96.01] Patient Active Problem List   Diagnosis Date Noted   Acute hypoxemic respiratory failure (HCC) 07/21/2023   Acute respiratory failure with hypoxia (HCC) 07/20/2023   Aspiration pneumonia of left lower lobe (HCC) 07/20/2023   Type 2 diabetes mellitus (HCC) 07/20/2023   Hypertension associated with diabetes (HCC) 07/20/2023   Hyperlipidemia associated with type 2 diabetes mellitus (HCC) 07/20/2023   Bladder outlet obstruction 07/20/2023   AKI (acute kidney injury) (HCC) 07/17/2021   Fecal impaction (HCC) 07/17/2021   Hemiplegia and hemiparesis following cerebral infarction affecting right dominant side (HCC) 03/13/2021   Paroxysmal atrial fibrillation (HCC) 12/28/2019   PCP:  Elijio Miles., MD Pharmacy:   [facility] [id number] [place], [place] - [address] AT [facility] [address] [place] [address] Phone: [phone] Fax: [phone]     Social Determinants of Health (SDOH) Social History: SDOH Screenings   Food Insecurity: Unknown (06/03/2023)   Received from [facility]  Utilities: Unknown (06/03/2023)   Received from [facility]  Social Connections: Unknown (08/06/2022)   Received from [facility]  Tobacco Use: Medium Risk  (07/20/2023)   SDOH Interventions:     Readmission Risk Interventions    07/21/2023    2:19 PM  Readmission Risk Prevention Plan  Post Dischage Appt Complete  Medication Screening Complete  Transportation Screening Complete

## 2023-07-21 NOTE — Evaluation (Signed)
Clinical/Bedside Swallow Evaluation Patient Details  Name: Dylan Park MRN: [id number] Date of Birth: 1951-09-09  Today's Date: 07/21/2023 Time: SLP Start Time (ACUTE ONLY): 1050 SLP Stop Time (ACUTE ONLY): 1146 SLP Time Calculation (min) (ACUTE ONLY): 56 min  Past Medical History:  Past Medical History:  Diagnosis Date   Diabetes mellitus without complication (HCC)    Hemiplegia and hemiparesis following cerebral infarction affecting right dominant side (HCC) 03/13/2021   03/21/21 D/C summary- He is admitted with a right leg weakness secondary to   acute left frontal lobe stroke.  Added automatically from request for surgery [id number]     Hyperlipidemia associated with type 2 diabetes mellitus (HCC) 07/20/2023   Hypertension associated with diabetes (HCC) 07/20/2023   Paroxysmal atrial fibrillation (HCC) 12/28/2019   Stroke [facility])    Past Surgical History: History reviewed. No pertinent surgical history. HPI:  72 year old male admitted to [facility] long hospital with poor intake over several days including nausea vomiting and diarrhea.  He has history of CVA with right hemiparesis and aphasia.  Foley was placed in the emergency department due to UBO with urinary retention.  Swallow eval ordered due to concerns patient may have aspirated.    Assessment / Plan / Recommendation  Clinical Impression  Patient presents with prolonged oral holding with liquids via cup for greater than a few minutes requiring oral suction to be set up and utilized to clear.  Liquids administered via straw were swallowed without delay.  Patient able to self-feed and consumed 2 saltine crackers, 4 ounces of ice cream and 4 ounces of ginger ale.  Delayed cough noted x 1 with ice cream question due to secretions mixed with retention and pharynx.  Otherwise with use of straw and patient self-feeding, he was able to manage p.o.  Wife reports patient will orally hold p.o. at home and therefore she questions if patient could  receive oral suction device, SLP would advise this for home use given patient does not suspected on command due to his motor planning deficits from stroke.  SLP used 3 different interpreters during the session due to interpreter iPad continuing to shut down.  Maurine Minister #[id number] Sanes #[id number], and Elease Hashimoto [phone] utilized.  Recommend dysphagia 3 diet due to right hemiparesis and dysphagia.  SLP will follow-up 1 time to assure patient is managing p.o. intake.  Wife endorses that patient has lost weight and she questions if he could have something to help to improve his appetite.  In addition she reports patient has frequent eructation for which she receives medications but the irritation issues recur.  She is unaware of him being on a PPI in the past.  Advised would relay information to MD. SLP Visit Diagnosis: Dysphagia, oral phase (R13.11)    Aspiration Risk  Mild aspiration risk;Risk for inadequate nutrition/hydration    Diet Recommendation Dysphagia 3 (Mech soft);Thin liquid    Medication Administration: Whole meds with puree (With ice cream, patient does not like applesauce) Supervision: Full supervision/cueing for compensatory strategies Compensations: Slow rate;Small sips/bites;Lingual sweep for clearance of pocketing Postural Changes: Remain upright for at least 30 minutes after po intake;Seated upright at 90 degrees    Other  Recommendations Oral Care Recommendations: Oral care BID    Recommendations for follow up therapy are one component of a multi-disciplinary discharge planning process, led by the attending physician.  Recommendations may be updated based on patient status, additional functional criteria and insurance authorization.  Follow up Recommendations    TBD    Assistance Recommended  at Discharge  Full  Functional Status Assessment Patient has had a recent decline in their functional status and demonstrates the ability to make significant improvements in function in a  reasonable and predictable amount of time.  Frequency and Duration min 1 x/week  1 week       Prognosis Prognosis for improved oropharyngeal function: Good Barriers to Reach Goals: Time post onset      Swallow Study   General Date of Onset: 07/21/23 HPI: 72 year old male admitted to [facility] long hospital with poor intake over several days including nausea vomiting and diarrhea.  He has history of CVA with right hemiparesis and aphasia.  Foley was placed in the emergency department due to UBO with urinary retention.  Swallow eval ordered due to concerns patient may have aspirated. Type of Study: Bedside Swallow Evaluation Previous Swallow Assessment: None in epic Diet Prior to this Study: NPO Temperature Spikes Noted: No Respiratory Status: Nasal cannula History of Recent Intubation: No Behavior/Cognition: Alert;Cooperative;Other (Comment);Doesn't follow directions (Difficulty with motor planning due to prior CVA impacting ability to conduct oral motor directions, i.e. cough, swallow) Oral Cavity Assessment: Within Functional Limits Oral Care Completed by SLP: Other (Comment) (with esposa) Oral Cavity - Dentition: Adequate natural dentition Vision: Functional for self-feeding Self-Feeding Abilities: Able to feed self Patient Positioning: Upright in bed Baseline Vocal Quality: Normal Volitional Cough: Cognitively unable to elicit Volitional Swallow: Unable to elicit    Oral/Motor/Sensory Function Overall Oral Motor/Sensory Function: Other (comment) (Patient has motor planning deficits resulting in inability to cough on command and difficulty voicing on cue.)   Ice Chips Ice chips: Not tested   Thin Liquid Thin Liquid: Impaired Presentation: Cup;Self Fed;Straw Oral Phase Functional Implications: Oral holding Pharyngeal  Phase Impairments: Suspected delayed Swallow Other Comments: Prolonged oral holding noted but patient able to initiate swallow with use of straws  SLP emergently  requested oral suction which Judeth Cornfield, nurse tech provided and oral suctioning provided to patient to clear liquids.  Use of straw effective to initiate swallow.    Nectar Thick Nectar Thick Liquid: Not tested   Honey Thick Honey Thick Liquid: Not tested   Puree Puree: Not tested Presentation: Self Fed Other Comments: Minimal delay in swallowing noted with delayed cough x 2.   Solid     Solid: Within functional limits Presentation: Self Orvan July 07/21/2023,12:27 PM Rolena Infante, MS [facility] SLP Acute Rehab Services Office [phone]

## 2023-07-21 NOTE — Progress Notes (Signed)
Progress Note    Dylan Park   JXB:[id number]  DOB: 08-05-1951  DOA: 07/20/2023     0 PCP: Elijio Miles., MD  Initial CC: Difficulty with urination, recent nausea, vomiting, diarrhea  Hospital Course: Dylan Park is a 72 y.o. male with medical history significant for history of CVA (with residual right hemiparesis, confusion, poor verbal baseline), PAF on Eliquis, T2DM, HTN, HLD, BPH who is admitted with acute hypoxic respiratory failure due to left lower lobe aspiration pneumonia. He was also found to have difficulty with urination leading to bladder outlet obstruction and bilateral hydronephrosis.  Moderate stool burden also appreciated on imaging.  Foley catheter was placed on admission and he was started on a bowel regimen.  Interval History:  No events overnight.  Resting in bed comfortably when seen.  Cognitive impairment appreciated along with poor verbal ability.  He was able to tell me his name when asked but could not answer any other orientation questions. Does follow commands but only can move left side.  Has dense right hemiplegia and borderline neglect.  Assessment and Plan:  Acute respiratory failure with hypoxia due to left lower lobe pneumonia Dysphagia Suspected aspiration pneumonia with recent emesis.  SpO2 reportedly down to 86% on RA in the ED, currently stable on 2 L O2 via [place].  Not on supplemental O2 at baseline. - continue unasyn; likely augmentin at discharge to complete course - NPO until swallow eval then diet as able  BPH with bladder outlet obstruction and urinary retention: CT showed distended bladder with dilated bilateral collecting systems and ureters.  Foley catheter was placed in the ED with 2 L UOP.  Continue Flomax. - voiding trial prior to d/c; if fails will need foley at d/c and outpatient urology referral   Constipation - could also contribute to urinary retention - bowel regimen started  Paroxysmal atrial fibrillation: Rate  controlled.  Continue Lopressor and Eliquis.   History of CVA with residual right hemiparesis: Appears to be at baseline.   - slow/impaired speech and cognitive impairment; dense right hemiparesis Continue Eliquis and atorvastatin.   Type 2 diabetes: Stable.  A1c 6.4% 06/03/2023.  Placed on SSI.   Hypertension: Continue Lopressor.   Hyperlipidemia: Continue atorvastatin.   Old records reviewed in assessment of this patient  Antimicrobials: Unasyn 07/20/2023 >> current  DVT prophylaxis:   apixaban (ELIQUIS) tablet 5 mg   Code Status:   Code Status: Full Code  Mobility Assessment (Last 72 Hours)     Mobility Assessment     Row Name 07/20/23 2200           Does patient have an order for bedrest or is patient medically unstable Yes- Bedfast (Level 1) - Complete                Barriers to discharge:  Disposition Plan: Home Status is: Observation  Objective: Blood pressure (!) 149/78, pulse 77, temperature (!) 97.4 F (36.3 C), temperature source Oral, resp. rate 17, height 5\' 10"  (1.778 m), weight 85.4 kg, SpO2 93%.  Examination:  Physical Exam Constitutional:      Comments: Chronically ill-appearing elderly gentleman lying in bed in no distress  HENT:     Head: Normocephalic and atraumatic.     Mouth/Throat:     Mouth: Mucous membranes are moist.  Eyes:     Extraocular Movements: Extraocular movements intact.  Cardiovascular:     Rate and Rhythm: Normal rate and regular rhythm.  Pulmonary:  Effort: Pulmonary effort is normal.     Breath sounds: Normal breath sounds.  Abdominal:     General: Bowel sounds are normal. There is no distension.     Palpations: Abdomen is soft.     Tenderness: There is no abdominal tenderness.  Musculoskeletal:        General: Normal range of motion.     Cervical back: Normal range of motion and neck supple.  Skin:    General: Skin is warm and dry.  Neurological:     Comments: Dense Right hemiplegia almost neglect;  moves LUE/LLE to commands. Oriented to name only      Consultants:    Procedures:    Data Reviewed: Results for orders placed or performed during the hospital encounter of 07/20/23 (from the past 24 hour(s))  Urinalysis, Routine w reflex microscopic -Urine, Clean Catch     Status: None   Collection Time: 07/20/23  1:51 PM  Result Value Ref Range   Color, Urine YELLOW YELLOW   APPearance CLEAR CLEAR   Specific Gravity, Urine 1.010 1.005 - 1.030   pH 6.0 5.0 - 8.0   Glucose, UA NEGATIVE NEGATIVE mg/dL   Hgb urine dipstick NEGATIVE NEGATIVE   Bilirubin Urine NEGATIVE NEGATIVE   Ketones, ur NEGATIVE NEGATIVE mg/dL   Protein, ur NEGATIVE NEGATIVE mg/dL   Nitrite NEGATIVE NEGATIVE   Leukocytes,Ua NEGATIVE NEGATIVE  CBC with Differential     Status: None   Collection Time: 07/20/23  1:52 PM  Result Value Ref Range   WBC 9.4 4.0 - 10.5 K/uL   RBC 4.60 4.22 - 5.81 MIL/uL   Hemoglobin 14.3 13.0 - 17.0 g/dL   HCT 72.5 36.6 - 44.0 %   MCV 92.8 80.0 - 100.0 fL   MCH 31.1 26.0 - 34.0 pg   MCHC 33.5 30.0 - 36.0 g/dL   RDW 34.7 42.5 - 95.6 %   Platelets 152 150 - 400 K/uL   nRBC 0.0 0.0 - 0.2 %   Neutrophils Relative % 71 %   Neutro Abs 6.6 1.7 - 7.7 K/uL   Lymphocytes Relative 21 %   Lymphs Abs 2.0 0.7 - 4.0 K/uL   Monocytes Relative 8 %   Monocytes Absolute 0.7 0.1 - 1.0 K/uL   Eosinophils Relative 0 %   Eosinophils Absolute 0.0 0.0 - 0.5 K/uL   Basophils Relative 0 %   Basophils Absolute 0.0 0.0 - 0.1 K/uL   Immature Granulocytes 0 %   Abs Immature Granulocytes 0.04 0.00 - 0.07 K/uL  Comprehensive metabolic panel     Status: Abnormal   Collection Time: 07/20/23  1:52 PM  Result Value Ref Range   Sodium 136 135 - 145 mmol/L   Potassium 4.2 3.5 - 5.1 mmol/L   Chloride 98 98 - 111 mmol/L   CO2 27 22 - 32 mmol/L   Glucose, Bld 119 (H) 70 - 99 mg/dL   BUN 15 8 - 23 mg/dL   Creatinine, Ser 3.87 0.61 - 1.24 mg/dL   Calcium 8.9 8.9 - 56.4 mg/dL   Total Protein 6.9 6.5 - 8.1  g/dL   Albumin 3.4 (L) 3.5 - 5.0 g/dL   AST 17 15 - 41 U/L   ALT 11 0 - 44 U/L   Alkaline Phosphatase 59 38 - 126 U/L   Total Bilirubin 1.0 0.3 - 1.2 mg/dL   GFR, Estimated >33 >29 mL/min   Anion gap 11 5 - 15  Lipase, blood     Status: None  Collection Time: 07/20/23  1:52 PM  Result Value Ref Range   Lipase 34 11 - 51 U/L  CBC     Status: Abnormal   Collection Time: 07/21/23  4:00 AM  Result Value Ref Range   WBC 7.8 4.0 - 10.5 K/uL   RBC 4.08 (L) 4.22 - 5.81 MIL/uL   Hemoglobin 12.9 (L) 13.0 - 17.0 g/dL   HCT 16.1 (L) 09.6 - 04.5 %   MCV 94.6 80.0 - 100.0 fL   MCH 31.6 26.0 - 34.0 pg   MCHC 33.4 30.0 - 36.0 g/dL   RDW 40.9 81.1 - 91.4 %   Platelets 144 (L) 150 - 400 K/uL   nRBC 0.0 0.0 - 0.2 %  Basic metabolic panel     Status: Abnormal   Collection Time: 07/21/23  4:00 AM  Result Value Ref Range   Sodium 138 135 - 145 mmol/L   Potassium 3.7 3.5 - 5.1 mmol/L   Chloride 104 98 - 111 mmol/L   CO2 24 22 - 32 mmol/L   Glucose, Bld 121 (H) 70 - 99 mg/dL   BUN 13 8 - 23 mg/dL   Creatinine, Ser 7.82 0.61 - 1.24 mg/dL   Calcium 8.7 (L) 8.9 - 10.3 mg/dL   GFR, Estimated >95 >62 mL/min   Anion gap 10 5 - 15  Glucose, capillary     Status: Abnormal   Collection Time: 07/21/23  7:55 AM  Result Value Ref Range   Glucose-Capillary 112 (H) 70 - 99 mg/dL    I have reviewed pertinent nursing notes, vitals, labs, and images as necessary. I have ordered labwork to follow up on as indicated.  I have reviewed the last notes from staff over past 24 hours. I have discussed patient's care plan and test results with nursing staff, CM/SW, and other staff as appropriate.  Time spent: Greater than 50% of the 55 minute visit was spent in counseling/coordination of care for the patient as laid out in the A&P.   LOS: 0 days   Lewie Chamber, MD [facility] 07/21/2023, 11:17 AM

## 2023-07-21 NOTE — Plan of Care (Signed)
  Problem: Safety: Goal: Ability to remain free from injury will improve Outcome: Completed/Met

## 2023-07-22 DIAGNOSIS — J9601 Acute respiratory failure with hypoxia: Secondary | ICD-10-CM | POA: Diagnosis not present

## 2023-07-22 LAB — GLUCOSE, CAPILLARY
Glucose-Capillary: 102 mg/dL — ABNORMAL HIGH (ref 70–99)
Glucose-Capillary: 155 mg/dL — ABNORMAL HIGH (ref 70–99)
Glucose-Capillary: 153 mg/dL — ABNORMAL HIGH (ref 70–99)
Glucose-Capillary: 118 mg/dL — ABNORMAL HIGH (ref 70–99)

## 2023-07-22 LAB — CBC WITH DIFFERENTIAL/PLATELET
Abs Immature Granulocytes: 0.04 10*3/uL (ref 0.00–0.07)
Basophils Absolute: 0 10*3/uL (ref 0.0–0.1)
Basophils Relative: 1 %
Eosinophils Absolute: 0.4 10*3/uL (ref 0.0–0.5)
Eosinophils Relative: 4 %
HCT: 39.3 % (ref 39.0–52.0)
Hemoglobin: 13 g/dL (ref 13.0–17.0)
Immature Granulocytes: 1 %
Lymphocytes Relative: 26 %
Lymphs Abs: 2.3 10*3/uL (ref 0.7–4.0)
MCH: 30.7 pg (ref 26.0–34.0)
MCHC: 33.1 g/dL (ref 30.0–36.0)
MCV: 92.9 fL (ref 80.0–100.0)
Monocytes Absolute: 0.7 10*3/uL (ref 0.1–1.0)
Monocytes Relative: 9 %
Neutro Abs: 5.2 10*3/uL (ref 1.7–7.7)
Neutrophils Relative %: 59 %
Platelets: 139 10*3/uL — ABNORMAL LOW (ref 150–400)
RBC: 4.23 MIL/uL (ref 4.22–5.81)
RDW: 12.6 % (ref 11.5–15.5)
WBC: 8.7 10*3/uL (ref 4.0–10.5)
nRBC: 0 % (ref 0.0–0.2)

## 2023-07-22 LAB — BASIC METABOLIC PANEL
Anion gap: 8 (ref 5–15)
BUN: 12 mg/dL (ref 8–23)
CO2: 25 mmol/L (ref 22–32)
Calcium: 8.4 mg/dL — ABNORMAL LOW (ref 8.9–10.3)
Chloride: 105 mmol/L (ref 98–111)
Creatinine, Ser: 0.89 mg/dL (ref 0.61–1.24)
GFR, Estimated: >60 mL/min (ref >60)
Glucose, Bld: 107 mg/dL — ABNORMAL HIGH (ref 70–99)
Potassium: 3.8 mmol/L (ref 3.5–5.1)
Sodium: 138 mmol/L (ref 135–145)

## 2023-07-22 LAB — MAGNESIUM: Magnesium: 1.8 mg/dL (ref 1.7–2.4)

## 2023-07-22 MED ORDER — CHLORHEXIDINE GLUCONATE CLOTH 2 % EX PADS
6.0000 | MEDICATED_PAD | Freq: Every day | CUTANEOUS | Status: DC
  Administered 2023-07-22 – 2023-07-24 (×3): 6 via TOPICAL

## 2023-07-22 NOTE — Evaluation (Signed)
Physical Therapy Evaluation Patient Details Name: Dylan Park MRN: [id number] DOB: [birth year] Today's Date: 07/22/2023  History of Present Illness  72 yo male admitted with acute respiratory failure with hypoxia, N/V/D, Pna. Hx of CVA with R hemiplegia, DM, Afib, memory loss  Clinical Impression  Co-eval with OT. On eval, pt required Mod A to roll, Mod A +2 for supine to sit, Max A +2 for sit to supine. Pt sat EOB for at least 5 minutes with Min guard A. Pt was tearful at times. Wife reports he has R UE  and back pain. He followed 1 step commands with translation assistance from wife and/or gestures. Assisted pt back to bed at end of session. Discussed d/c plan with wife-she plans to continue taking care of patient at home. She reports he has been in a SNF before and it wasn't a good experience. She reports he has been working with HHPT but not making much progress. If pt returns home, recommend hospital bed with air mattress, hoyer lift, and home health aide, and PTAR transport.       Assistance Recommended at Discharge Frequent or constant Supervision/Assistance  If plan is discharge home, recommend the following:  Can travel by private vehicle  Two people to help with walking and/or transfers;Two people to help with bathing/dressing/bathroom;Help with stairs or ramp for entrance;Assist for transportation;Assistance with cooking/housework;Direct supervision/assist for financial management;Assistance with feeding;Direct supervision/assist for medications management        Equipment Recommendations Hospital bed (air mattress for hospital bed. Hoyer Lift.)  Recommendations for [employer]       Functional Status Assessment Patient has had a recent decline in their functional status and/or demonstrates limited ability to make significant improvements in function in a reasonable and predictable amount of time     Precautions / Restrictions Precautions Precautions: Fall Precaution  Comments: R hemiplegia; R UE pain, back pain Restrictions Weight Bearing Restrictions: No      Mobility  Bed Mobility Overal bed mobility: Needs Assistance Bed Mobility: Rolling, Supine to Sit, Sit to Supine Rolling: Mod assist   Supine to sit: Mod assist, +2 for physical assistance, +2 for safety/equipment, HOB elevated Sit to supine: Max assist, +2 for physical assistance, +2 for safety/equipment, HOB elevated   General bed mobility comments: Multimodal cueing provided. Pt able to use bedrail and assist with pulling up into sitting and getting to EOB. Assist required for trunk and R LE. Utilized bedpd to assist. Pt able to reposition in bed with cues, use of bedrail and assist for R LE/UE. Sat EOB for at least 5 minutes with Min guard A.    Transfers                   General transfer comment: NT    Ambulation/Gait                  Stairs            Wheelchair Mobility     Tilt Bed    Modified Rankin (Stroke Patients Only)       Balance Overall balance assessment: Needs assistance, History of Falls Sitting-balance support: Single extremity supported, No upper extremity supported, Feet supported Sitting balance-Leahy Scale: Fair Sitting balance - Comments: Fair static sitting balance with and without UE support.  Pertinent Vitals/Pain Pain Assessment Pain Assessment: Faces Faces Pain Scale: Hurts whole lot Pain Location: R UE, back (per wife) Pain Descriptors / Indicators: Crying, Grimacing, Moaning Pain Intervention(s): Limited activity within patient's tolerance, Monitored during session, Repositioned    Home Living Family/patient expects to be discharged to:: Private residence Living Arrangements: Spouse/significant other Available Help at Discharge: Available 24 hours/day;Family Type of Home: House Home Access: Ramped entrance       Home Layout: Two level;Able to live on main  level with bedroom/bathroom Home Equipment: Wheelchair - manual;Hospital bed;Wheelchair - power Additional Comments: has a chair that he has not used since the strokes cant remember how to drive it. transfer board,    Prior Function Prior Level of Function : Needs assist               ADLs Comments: patients wife reports that patient is bed bound at home.     Hand Dominance        Extremity/Trunk Assessment   Upper Extremity Assessment Upper Extremity Assessment: Defer to OT evaluation    Lower Extremity Assessment Lower Extremity Assessment: RLE deficits/detail;LLE deficits/detail RLE Deficits / Details: R hemi without functional use of extremity RLE Sensation: decreased proprioception RLE Coordination: decreased gross motor;decreased fine motor LLE Deficits / Details: at least 3/5 throughout       Communication   Communication: Prefers language other than English (spanish)  Cognition Arousal/Alertness: Awake/alert Behavior During Therapy: WFL for tasks assessed/performed Overall Cognitive Status: Difficult to assess                                 General Comments: interpreter Toniann Fail [phone], Jacelyn Pi [phone] used during session.        General Comments      Exercises     Assessment/Plan    PT Assessment Patient needs continued PT services  PT Problem List Decreased strength;Decreased coordination;Decreased range of motion;Decreased activity tolerance;Decreased balance;Decreased cognition;Pain;Decreased knowledge of use of DME;Decreased skin integrity       PT Treatment Interventions DME instruction;Therapeutic exercise;Balance training;Functional mobility training;Therapeutic activities;Patient/family education;Neuromuscular re-education;Wheelchair mobility training    PT Goals (Current goals can be found in the Care Plan section)  Acute Rehab PT Goals Patient Stated Goal: wife wants to take patient back home PT Goal Formulation: With  family Time For Goal Achievement: 08/05/23 Potential to Achieve Goals: Fair    Frequency Min 1X/week     Co-evaluation               AM-PAC PT "6 Clicks" Mobility  Outcome Measure Help needed turning from your back to your side while in a flat bed without using bedrails?: A Lot Help needed moving from lying on your back to sitting on the side of a flat bed without using bedrails?: A Lot Help needed moving to and from a bed to a chair (including a wheelchair)?: Total Help needed standing up from a chair using your arms (e.g., wheelchair or bedside chair)?: Total Help needed to walk in hospital room?: Total Help needed climbing 3-5 steps with a railing? : Total 6 Click Score: 8    End of Session   Activity Tolerance: Patient tolerated treatment well;Patient limited by pain Patient left: in bed;with call bell/phone within reach;with family/visitor present;with bed alarm set   PT Visit Diagnosis: History of falling (Z91.81);Pain;Hemiplegia and hemiparesis;Muscle weakness (generalized) (M62.81) Hemiplegia - Right/Left: Right Hemiplegia - caused by: Cerebral infarction Pain - Right/Left:  Right Pain - part of body: Shoulder    Time: 1610-9604 PT Time Calculation (min) (ACUTE ONLY): 39 min   Charges:   PT Evaluation $PT Eval Low Complexity: 1 Low   PT General Charges $$ ACUTE PT VISIT: 1 Visit            Faye Ramsay, PT Acute Rehabilitation  Office: [phone]

## 2023-07-22 NOTE — Progress Notes (Signed)
PROGRESS NOTE  Dylan Park  DOB: 1951-02-27  PCP: Elijio Miles., MD YQM:[id number]  DOA: 07/20/2023  LOS: 1 day  Hospital Day: 3  Brief narrative: Dylan Park is a 72 y.o. 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  In the ED, afebrile, hemodynamically stable, O2 sat dropped to 86% on room air requiring supplemental oxygen CT head did not show acute intracranial findings, showed multiple old infarcts Chest x-ray showed increased markings in the posterior left lung CT abdomen pelvis -Dilation of the collecting systems and ureters on both sides, marked distention of urinary bladder, no renal or ureteral stone -Moderate stool burden in colon and rectum -Increased markings in the posterior left lower lung  Foley catheter was inserted with drainage of 2 L of clear urine. Patient was started on IV antibiotics Admitted to [facility]  Subjective: Patient was seen and examined this morning.  Elderly male.  Lying on bed.  Not in distress.  He has significant expressive aphasia.  Able to utter few words intermittently.  No family at bedside Chart reviewed Afebrile, blood pressure 163/84 this morning, Labs from this morning unremarkable  Assessment and plan: Acute respiratory failure with hypoxia  Left lower lobe pneumonia Dysphagia Patient has dysphagia at baseline due to stroke in 2022. Presented with multiple days of vomiting followed by shortness of breath, probably aspirated Initial chest x-ray with posterior left lower lung opacities Improving with IV Unasyn Initially required 2 L oxygen by nasal cannula.  Currently remains on the same.  Check ambulatory send appointment Seen by speech on 7/23.  Started on dysphagia 3  diet.  Acute urinary retention BPH s/p TURP CT abdomen and pelvis showed marked distention of bladder and bilateral hydronephrosis without stone Foley catheter was inserted with drainage of 2 L of clear urine Acute retention could have been secondary to constipation. Flomax 0.4 mg daily started Given his severely distended bladder had presentation for unknown duration, I would keep the Foley catheter in place for next few days and plan for voiding trial as an outpatient with urology.  Constipation Moderate stool burden noted in colon and rectum Bowel regimen started with a scheduled Senokot twice daily, MiraLAX daily and Dulcolax as needed  last bowel movement charted as yesterday.   Paroxysmal atrial fibrillation: Rate controlle on Lopressor  Continue chronic anticoagulation with Eliquis.   H/o CVA, HLD Residual right hemiparesis, mild cognitive impairment, dysphagia, aphasia Deficits at baseline Continue Eliquis and Crestor.  Type 2 diabetes mellitus A1c 6.3 in 7/21 PTA meds-insulin aspart, metformin 500 mg twice daily Currently on SSI/Accu-Cheks Recent Labs  Lab 07/21/23 1217 07/21/23 1626 07/21/23 2056 07/22/23 0706 07/22/23 1138  GLUCAP 163* 163* 108* 102* 155*   Hypertension Controlled on Lopressor.   Mobility: PT eval ordered  Goals of care   Code Status: Full Code     DVT prophylaxis:   apixaban (ELIQUIS) tablet 5 mg   Antimicrobials: IV Unasyn Fluid: None currently Consultants: None Family Communication: None at bedside  Status: Inpatient Level of care:  Telemetry   Patient from: Home Anticipated d/c to: Pending clinical course, pending PT eval Needs to continue in-hospital care:     Diet:  Diet Order             DIET DYS  3 Room service appropriate? Yes with Assist; Fluid consistency: Thin  Diet effective now                   Scheduled Meds:  apixaban  5 mg Oral BID   atorvastatin  80 mg Oral Daily   insulin aspart  0-9 Units  Subcutaneous TID WC   metoprolol tartrate  25 mg Oral BID   polyethylene glycol  17 g Oral Daily   senna-docusate  1 tablet Oral BID   sodium chloride flush  3 mL Intravenous Q12H   tamsulosin  0.4 mg Oral Daily    PRN meds: sodium chloride, acetaminophen **OR** acetaminophen, bisacodyl, ondansetron **OR** ondansetron (ZOFRAN) IV   Infusions:   sodium chloride Stopped (07/20/23 1938)   ampicillin-sulbactam (UNASYN) IV 1.5 g (07/22/23 1414)    Antimicrobials: Anti-infectives (From admission, onward)    Start     Dose/Rate Route Frequency Ordered Stop   07/21/23 0000  ampicillin-sulbactam (UNASYN) 1.5 g in sodium chloride 0.9 % 100 mL IVPB        1.5 g 200 mL/hr over 30 Minutes Intravenous Every 6 hours 07/20/23 2233     07/20/23 1715  levofloxacin (LEVAQUIN) IVPB 750 mg        750 mg 100 mL/hr over 90 Minutes Intravenous  Once 07/20/23 1701 07/20/23 1935       Nutritional status:  Body mass index is 27 kg/m.          Objective: Vitals:   07/22/23 0542 07/22/23 1238  BP: (!) 163/84 (!) 146/77  Pulse: (!) 52 67  Resp: 18 18  Temp: 97.9 F (36.6 C) 98 F (36.7 C)  SpO2: 94% 100%    Intake/Output Summary (Last 24 hours) at 07/22/2023 1554 Last data filed at 07/22/2023 1213 Gross per 24 hour  Intake 963 ml  Output 1525 ml  Net -562 ml   Filed Weights   07/20/23 1243  Weight: 85.4 kg   Weight change:  Body mass index is 27 kg/m.   Physical Exam: General exam: Pleasant Caucasian male.  Not in physical distress Skin: No rashes, lesions or ulcers. HEENT: Atraumatic, normocephalic, no obvious bleeding Lungs: Clear to auscultation bilaterally CVS: Regular rate and rhythm, no murmur GI/Abd soft, nontender, nondistended, bowel sound present CNS: Alert, awake, significant course of aphasia, right hemiparesis Psychiatry: Sad affect Extremities: No pedal edema, no calf tenderness  Data Review: I have personally reviewed the laboratory data and studies  available.  F/u labs ordered Unresulted Labs (From admission, onward)     Start     Ordered   07/22/23 0500  Basic metabolic panel  Daily,   R     Question:  Specimen collection method  Answer:  Lab=Lab collect   07/21/23 1354   07/22/23 0500  CBC with Differential/Platelet  Daily,   R     Question:  Specimen collection method  Answer:  Lab=Lab collect   07/21/23 1354   07/22/23 0500  Magnesium  Daily,   R     Question:  Specimen collection method  Answer:  Lab=Lab collect   07/21/23 1354   07/21/23 1850  C Difficile Quick Screen w PCR reflex  (C Difficile quick screen w PCR reflex panel )  Once, for 24 hours,   TIMED       References:    CDiff Information Tool   07/21/23 1849   07/21/23 1850  Gastrointestinal Panel by PCR , Stool  (Gastrointestinal Panel by PCR, Stool                                                                                                                                                     **  Does Not include CLOSTRIDIUM DIFFICILE testing. **If CDIFF testing is needed, place order from the "C Difficile Testing" order set.**)  Once,   R        07/21/23 1849   07/20/23 2231  Resp panel by RT-PCR (RSV, Flu A&B, Covid) Anterior Nasal Swab  (COPD / Pneumonia / Cellulitis / Lower Extremity Wound)  Once,   R        07/20/23 2232   07/20/23 2230  Strep pneumoniae urinary antigen  (COPD / Pneumonia / Cellulitis / Lower Extremity Wound)  Once,   R        07/20/23 2232            Total time spent in review of labs and imaging, patient evaluation, formulation of plan, documentation and communication with family: 55 minutes  Signed, Lorin Glass, MD [facility] 07/22/2023

## 2023-07-22 NOTE — Plan of Care (Signed)
  Problem: Education: Goal: Knowledge of General Education information will improve Description: Including pain rating scale, medication(s)/side effects and non-pharmacologic comfort measures Outcome: Not Progressing   Problem: Health Behavior/Discharge Planning: Goal: Ability to manage health-related needs will improve Outcome: Not Progressing   

## 2023-07-22 NOTE — Evaluation (Signed)
Occupational Therapy Evaluation Patient Details Name: Dylan Park MRN: [id number] DOB: 1951-10-11 Today's Date: 07/22/2023   History of Present Illness patient is a 72 year old male who was admitted with acute respiratory failure with hypoxia, N/V/D, Pna. Hx of CVA with R hemiplegia, DM, Afib, memory loss   Clinical Impression   Patient is a 72 year old male who was admitted for above. Patient lives at home with wife support prior level. Currently, patient is mod A +2 for supine to sit on edge of bed with increased time and cues with notable language barrier with patient. Patients wife plans to take patient back home at time of d/c. Patient was noted to have decreased functional activity tolernace, decreased ROM, decreased BUE strength, decreased endurance, decreased sitting balance, decreased standing balanced, decreased safety awareness, and decreased knowledge of AE/AD impacting participation in ADLs. Patient would continue to benefit from skilled OT services at this time while admitted and after d/c to address noted deficits in order to improve overall safety and independence in ADLs.       Recommendations for follow up therapy are one component of a multi-disciplinary discharge planning process, led by the attending physician.  Recommendations may be updated based on patient status, additional functional criteria and insurance authorization.   Assistance Recommended at Discharge Frequent or constant Supervision/Assistance  Patient can return home with the following Two people to help with walking and/or transfers;A lot of help with bathing/dressing/bathroom;Assistance with cooking/housework;Direct supervision/assist for medications management;Assist for transportation;Help with stairs or ramp for entrance;Direct supervision/assist for financial management;Assistance with feeding    Functional Status Assessment  Patient has had a recent decline in their functional status and/or  demonstrates limited ability to make significant improvements in function in a reasonable and predictable amount of time  Equipment Recommendations  Hospital bed;Other (comment) (air matress for hospital bed)    Recommendations for Other Services       Precautions / Restrictions Precautions Precautions: Fall Precaution Comments: R hemiplegia; R UE pain, back pain Restrictions Weight Bearing Restrictions: No      Mobility Bed Mobility Overal bed mobility: Needs Assistance Bed Mobility: Rolling, Supine to Sit, Sit to Supine Rolling: Mod assist   Supine to sit: Mod assist, +2 for physical assistance, +2 for safety/equipment, HOB elevated Sit to supine: Max assist, +2 for physical assistance, +2 for safety/equipment, HOB elevated   General bed mobility comments: Multimodal cueing provided. Pt able to use bedrail and assist with pulling up into sitting and getting to EOB. Assist required for trunk and R LE. Utilized bedpd to assist. Pt able to reposition in bed with cues, use of bedrail and assist for R LE/UE. Sat EOB for at least 5 minutes with Min guard A.          Balance Overall balance assessment: Needs assistance, History of Falls Sitting-balance support: Single extremity supported, No upper extremity supported, Feet supported Sitting balance-Leahy Scale: Fair Sitting balance - Comments: Fair static sitting balance with and without UE support.       ADL either performed or assessed with clinical judgement   ADL Overall ADL's : Needs assistance/impaired Eating/Feeding: Maximal assistance;Sitting   Grooming: Bed level;Maximal assistance   Upper Body Bathing: Bed level;Total assistance   Lower Body Bathing: Bed level;Total assistance   Upper Body Dressing : Bed level;Total assistance   Lower Body Dressing: Bed level;Total assistance   Toilet Transfer: +2 for physical assistance;+2 for safety/equipment Toilet Transfer Details (indicate cue type and reason): patient  was  able to transition to sit on EOB with +2 A patients wife reported that patient is bed bound at home. Toileting- Clothing Manipulation and Hygiene: Total assistance;Bed level               Vision   Additional Comments: difficult to formally assess with language barrier and patients cog. able to follow patients leg movements and mimic on the L side.            Pertinent Vitals/Pain Pain Assessment Pain Assessment: Faces Faces Pain Scale: Hurts whole lot Pain Location: R UE, back (per wife) Pain Descriptors / Indicators: Crying, Grimacing, Moaning Pain Intervention(s): Limited activity within patient's tolerance, Monitored during session, Repositioned     Hand Dominance     Extremity/Trunk Assessment Upper Extremity Assessment Upper Extremity Assessment: RUE deficits/detail RUE Deficits / Details: patient with increased pain in shoulder with no subluxation noted at this time. patietn was able to complete gross grasp in this hand with mit in place. patient attempting to pull lines. patient not able to lift shoulder without using LUE noted to be red around shoulder on R side but patient also leaning to R side in bed with pillows to attempt to limit leaning.   Lower Extremity Assessment Lower Extremity Assessment: RLE deficits/detail;LLE deficits/detail RLE Deficits / Details: R hemi without functional use of extremity RLE Sensation: decreased proprioception RLE Coordination: decreased gross motor;decreased fine motor LLE Deficits / Details: at least 3/5 throughout       Communication Communication Communication: Prefers language other than English (spanish)   Cognition Arousal/Alertness: Awake/alert Behavior During Therapy: WFL for tasks assessed/performed Overall Cognitive Status: Difficult to assess         General Comments: interpreter Toniann Fail [phone], Laverna Peace [phone] used during session. wife was able to get patient to communicate.                Home  Living Family/patient expects to be discharged to:: Private residence Living Arrangements: Spouse/significant other Available Help at Discharge: Available 24 hours/day;Family Type of Home: House Home Access: Ramped entrance     Home Layout: Two level;Able to live on main level with bedroom/bathroom               Home Equipment: Wheelchair - manual;Hospital bed;Wheelchair - power   Additional Comments: has a chair that he has not used since the strokes cant remember how to drive it. transfer board,      Prior Functioning/Environment Prior Level of Function : Needs assist               ADLs Comments: patients wife reports that patient is bed bound at home.        OT Problem List: Decreased activity tolerance;Impaired balance (sitting and/or standing);Decreased coordination;Decreased safety awareness;Decreased knowledge of precautions;Decreased knowledge of use of DME or AE;Pain;Impaired UE functional use      OT Treatment/Interventions: Self-care/ADL training;Energy conservation;Therapeutic exercise;DME and/or AE instruction;Therapeutic activities;Patient/family education;Balance training    OT Goals(Current goals can be found in the care plan section) Acute Rehab OT Goals Patient Stated Goal: none stated OT Goal Formulation: With family Time For Goal Achievement: 08/05/23 Potential to Achieve Goals: Fair  OT Frequency: Min 1X/week    Co-evaluation PT/OT/SLP Co-Evaluation/Treatment: Yes   PT goals addressed during session: Mobility/safety with mobility OT goals addressed during session: ADL's and self-care      AM-PAC OT "6 Clicks" Daily Activity     Outcome Measure Help from another person eating meals?: A Lot Help from another  person taking care of personal grooming?: A Lot Help from another person toileting, which includes using toliet, bedpan, or urinal?: Total Help from another person bathing (including washing, rinsing, drying)?: Total Help from another  person to put on and taking off regular upper body clothing?: Total Help from another person to put on and taking off regular lower body clothing?: Total 6 Click Score: 8   End of Session Nurse Communication: Mobility status;Other (comment) (patients wife request for meal to be ordered.)  Activity Tolerance: Patient tolerated treatment well;Patient limited by pain Patient left: in bed;with call bell/phone within reach;with family/visitor present;with bed alarm set  OT Visit Diagnosis: Unsteadiness on feet (R26.81);Other abnormalities of gait and mobility (R26.89);Cognitive communication deficit (R41.841);Hemiplegia and hemiparesis                Time: 8657-8469 OT Time Calculation (min): 40 min Charges:  OT General Charges $OT Visit: 1 Visit OT Evaluation $OT Eval Moderate Complexity: 1 Mod  Danarius Mcconathy OTR/L, MS Acute Rehabilitation Department Office# [phone]   Selinda Flavin 07/22/2023, 5:02 PM

## 2023-07-22 NOTE — TOC Progression Note (Signed)
Transition of Care [facility]) - Progression Note    Patient Details  Name: Dylan Park MRN: [id number] Date of Birth: 08-24-1951  [facility]) CM/SW Contact  Howell Rucks, RN Phone Number: 07/22/2023, 1:17 PM  Clinical Narrative:   Pt eval pending, await recommendation. TOC will continue to follow.    Expected Discharge Plan: Home/Self Care Barriers to Discharge: Continued Medical Work up  Expected Discharge Plan and Services       Living arrangements for the past 2 months: Single Family Home                                       Social Determinants of Health (SDOH) Interventions SDOH Screenings   Food Insecurity: Unknown (06/03/2023)   Received from [facility]  Utilities: Unknown (06/03/2023)   Received from [facility]  Social Connections: Unknown (08/06/2022)   Received from [facility], [facility]  Tobacco Use: Medium Risk (07/20/2023)    Readmission Risk Interventions    07/21/2023    2:19 PM  Readmission Risk Prevention Plan  Post Dischage Appt Complete  Medication Screening Complete  Transportation Screening Complete

## 2023-07-23 DIAGNOSIS — J9601 Acute respiratory failure with hypoxia: Secondary | ICD-10-CM | POA: Diagnosis not present

## 2023-07-23 LAB — MAGNESIUM: Magnesium: 1.7 mg/dL (ref 1.7–2.4)

## 2023-07-23 LAB — CBC WITH DIFFERENTIAL/PLATELET
Abs Immature Granulocytes: 0.03 10*3/uL (ref 0.00–0.07)
Basophils Absolute: 0 10*3/uL (ref 0.0–0.1)
Basophils Relative: 0 %
Eosinophils Absolute: 0.4 10*3/uL (ref 0.0–0.5)
Eosinophils Relative: 5 %
HCT: 40.8 % (ref 39.0–52.0)
Hemoglobin: 13.8 g/dL (ref 13.0–17.0)
Immature Granulocytes: 0 %
Lymphocytes Relative: 25 %
Lymphs Abs: 2.3 10*3/uL (ref 0.7–4.0)
MCV: 93.2 fL (ref 80.0–100.0)
Monocytes Relative: 8 %
Neutro Abs: 5.6 10*3/uL (ref 1.7–7.7)
Neutrophils Relative %: 62 %
Platelets: 170 10*3/uL (ref 150–400)
RBC: 4.38 MIL/uL (ref 4.22–5.81)
RDW: 12.4 % (ref 11.5–15.5)
nRBC: 0 % (ref 0.0–0.2)

## 2023-07-23 LAB — BASIC METABOLIC PANEL
Anion gap: 12 (ref 5–15)
BUN: 6 mg/dL — ABNORMAL LOW (ref 8–23)
Calcium: 8.6 mg/dL — ABNORMAL LOW (ref 8.9–10.3)
Chloride: 104 mmol/L (ref 98–111)
Creatinine, Ser: 0.78 mg/dL (ref 0.61–1.24)
GFR, Estimated: >60 mL/min (ref >60)
Glucose, Bld: 98 mg/dL (ref 70–99)
Sodium: 138 mmol/L (ref 135–145)

## 2023-07-23 LAB — GLUCOSE, CAPILLARY
Glucose-Capillary: 109 mg/dL — ABNORMAL HIGH (ref 70–99)
Glucose-Capillary: 168 mg/dL — ABNORMAL HIGH (ref 70–99)
Glucose-Capillary: 118 mg/dL — ABNORMAL HIGH (ref 70–99)
Glucose-Capillary: 139 mg/dL — ABNORMAL HIGH (ref 70–99)

## 2023-07-23 NOTE — Progress Notes (Signed)
SLP Cancellation Note  Patient Details Name: Dylan Park MRN: [id number] DOB: 06-19-1951   Cancelled treatment:       Reason Eval/Treat Not Completed: Other (comment)  Rolena Infante, MS [facility] SLP Acute Rehab Services Office [phone]   Chales Abrahams 07/23/2023, 4:43 PM

## 2023-07-23 NOTE — TOC Progression Note (Addendum)
Transition of Care [facility]) - Progression Note    Patient Details  Name: Dylan Park MRN: [id number] Date of Birth: Jan 01, 1951  Transition of Care [facility]) CM/SW Contact  Howell Rucks, RN Phone Number: 07/23/2023, 1:31 PM  Clinical Narrative:  PT eval completed, recommendation for short term rehab-SNF. Will present to pt when wife is available at bedside to use video interpreter, wife's primary language in Spanish. TOC will continue to follow.    -2:58pm Teams chat received from bedside nurse: Carollee Herter with [facility] [phone] called regarding a patient update. I spoke with him and he stated that they have staff come 4hrs/day for a total of 12-13 hours/week.   -3:21pm PT recomm SNF,  NCM spoke with pt's spouse at pt's bedside on mobile speaker phone using Spanish video interpreter, spouse declined SNF, will dc home with resumption of current home care services. Team notified. TOC will continue to follow.   -3:36pm  PT recommendation for HHPT, Home Health Aide if pt returns home. NCM called to Carollee Herter at [facility] to verify services patient currently receiving, await call back. Patient currently receiving HH PT/OT with [facility].     Expected Discharge Plan: Home/Self Care Barriers to Discharge: Continued Medical Work up  Expected Discharge Plan and Services       Living arrangements for the past 2 months: Single Family Home                                       Social Determinants of Health (SDOH) Interventions SDOH Screenings   Food Insecurity: Unknown (06/03/2023)   Received from [facility]  Utilities: Unknown (06/03/2023)   Received from [facility]  Social Connections: Unknown (08/06/2022)   Received from [facility]  Tobacco Use: Medium Risk (07/20/2023)    Readmission Risk Interventions    07/21/2023    2:19 PM  Readmission Risk Prevention Plan  Post Dischage Appt Complete  Medication Screening Complete   Transportation Screening Complete

## 2023-07-23 NOTE — Progress Notes (Signed)
PROGRESS NOTE  Dylan Park  DOB: Apr 22, 1951  PCP: Elijio Miles., MD WUJ:[id number]  DOA: 07/20/2023  LOS: 2 days  Hospital Day: 4  Brief narrative: Dylan Park is a 72 y.o. 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  In the ED, afebrile, hemodynamically stable, O2 sat dropped to 86% on room air requiring supplemental oxygen CT head did not show acute intracranial findings, showed multiple old infarcts Chest x-ray showed increased markings in the posterior left lung CT abdomen pelvis -Dilation of the collecting systems and ureters on both sides, marked distention of urinary bladder, no renal or ureteral stone -Moderate stool burden in colon and rectum -Increased markings in the posterior left lower lung  Foley catheter was inserted with drainage of 2 L of clear urine. Patient was started on IV antibiotics Admitted to [facility]  Subjective: Patient was seen and examined this morning.   Propped up in bed.  Has baseline expressive aphasia.  Able to feed himself.  No family at bedside.  Reports he had a bowel movement this morning which I could not confirm from nurse.  Per RN, he has been refusing to take his MiraLAX.  Assessment and plan: Acute respiratory failure with hypoxia  Left lower lobe pneumonia Dysphagia Patient has dysphagia at baseline due to stroke in 2022. Presented with multiple days of vomiting followed by shortness of breath, probably aspirated Initial chest x-ray with posterior left lower lung opacities Improving with IV Unasyn Initially required 2 L oxygen by nasal cannula.  Currently remains on the same. Seen by speech on 7/23.  Started on dysphagia 3 diet.  Acute urinary retention BPH s/p TURP CT abdomen and  pelvis showed marked distention of bladder and bilateral hydronephrosis without stone Foley catheter was inserted with drainage of 2 L of clear urine Acute retention could have been secondary to constipation. Flomax 0.4 mg daily started Given his severely distended bladder had presentation for unknown duration, I would keep the Foley catheter in place for next few days and plan for voiding trial as an outpatient with urology.  Constipation Moderate stool burden noted in colon and rectum Bowel regimen started with a scheduled Senokot twice daily, MiraLAX daily and Dulcolax as needed  Reports he had a bowel movement this morning which I could not confirm from nurse.  Per RN, he has been refusing to take his MiraLAX.  I encouraged him to comply with bowel regimen.   Paroxysmal atrial fibrillation: Rate controlle on Lopressor  Continue chronic anticoagulation with Eliquis.   H/o CVA, HLD Residual right hemiparesis, mild cognitive impairment, dysphagia, aphasia Deficits at baseline Continue Eliquis and Crestor.  Type 2 diabetes mellitus A1c 6.3 in 7/21 PTA meds-insulin aspart, metformin 500 mg twice daily Currently on SSI/Accu-Cheks Recent Labs  Lab 07/22/23 0706 07/22/23 1138 07/22/23 1703 07/22/23 2051 07/23/23 0718  GLUCAP 102* 155* 153* 118* 109*   Hypertension Controlled on Lopressor.   Impaired mobility  PT eval obtained.  SNF recommended  Goals of care   Code Status: Full Code     DVT prophylaxis:   apixaban (ELIQUIS) tablet 5 mg   Antimicrobials: IV Unasyn Fluid: None currently Consultants: None Family Communication: None at bedside  Status: Inpatient Level of care:  Telemetry   Patient from: Home Anticipated  d/c to: SNF recommended by PT. Needs to continue in-hospital care:     Diet:  Diet Order             DIET DYS 3 Room service appropriate? Yes with Assist; Fluid consistency: Thin  Diet effective now                   Scheduled Meds:   apixaban  5 mg Oral BID   atorvastatin  80 mg Oral Daily   Chlorhexidine Gluconate Cloth  6 each Topical Daily   insulin aspart  0-9 Units Subcutaneous TID WC   metoprolol tartrate  25 mg Oral BID   polyethylene glycol  17 g Oral Daily   senna-docusate  1 tablet Oral BID   sodium chloride flush  3 mL Intravenous Q12H   tamsulosin  0.4 mg Oral Daily    PRN meds: sodium chloride, acetaminophen **OR** acetaminophen, bisacodyl, ondansetron **OR** ondansetron (ZOFRAN) IV   Infusions:   sodium chloride Stopped (07/20/23 1938)   ampicillin-sulbactam (UNASYN) IV 1.5 g (07/23/23 0427)    Antimicrobials: Anti-infectives (From admission, onward)    Start     Dose/Rate Route Frequency Ordered Stop   07/21/23 0000  ampicillin-sulbactam (UNASYN) 1.5 g in sodium chloride 0.9 % 100 mL IVPB        1.5 g 200 mL/hr over 30 Minutes Intravenous Every 6 hours 07/20/23 2233     07/20/23 1715  levofloxacin (LEVAQUIN) IVPB 750 mg        750 mg 100 mL/hr over 90 Minutes Intravenous  Once 07/20/23 1701 07/20/23 1935       Nutritional status:  Body mass index is 27 kg/m.          Objective: Vitals:   07/22/23 2056 07/23/23 0642  BP: (!) 163/103 (!) 154/69  Pulse: 89 66  Resp: 20 20  Temp: 98.8 F (37.1 C) 98.5 F (36.9 C)  SpO2: 95% 98%    Intake/Output Summary (Last 24 hours) at 07/23/2023 1028 Last data filed at 07/23/2023 0643 Gross per 24 hour  Intake 3 ml  Output 1750 ml  Net -1747 ml   Filed Weights   07/20/23 1243  Weight: 85.4 kg   Weight change:  Body mass index is 27 kg/m.   Physical Exam: General exam: Pleasant Caucasian male.  Not in physical distress Skin: No rashes, lesions or ulcers. HEENT: Atraumatic, normocephalic, no obvious bleeding Lungs: Clear to auscultation bilaterally CVS: Regular rate and rhythm, no murmur GI/Abd soft, nontender, nondistended, bowel sound present CNS: Alert, awake, significant course of aphasia, right hemiparesis Psychiatry:  Frustrated because of aphasia Extremities: No pedal edema, no calf tenderness  Data Review: I have personally reviewed the laboratory data and studies available.  F/u labs ordered Unresulted Labs (From admission, onward)     Start     Ordered   07/22/23 0500  Basic metabolic panel  Daily,   R     Question:  Specimen collection method  Answer:  Lab=Lab collect   07/21/23 1354   07/22/23 0500  CBC with Differential/Platelet  Daily,   R     Question:  Specimen collection method  Answer:  Lab=Lab collect   07/21/23 1354   07/22/23 0500  Magnesium  Daily,   R     Question:  Specimen collection method  Answer:  Lab=Lab collect   07/21/23 1354   07/21/23 1850  C Difficile Quick Screen w PCR reflex  (C Difficile quick screen w PCR reflex panel )  Once, for 24 hours,   TIMED       References:    CDiff Information Tool   07/21/23 1849   07/21/23 1850  Gastrointestinal Panel by PCR , Stool  (Gastrointestinal Panel by PCR, Stool                                                                                                                                                     **Does Not include CLOSTRIDIUM DIFFICILE testing. **If CDIFF testing is needed, place order from the "C Difficile Testing" order set.**)  Once,   R        07/21/23 1849   07/20/23 2231  Resp panel by RT-PCR (RSV, Flu A&B, Covid) Anterior Nasal Swab  (COPD / Pneumonia / Cellulitis / Lower Extremity Wound)  Once,   R        07/20/23 2232   07/20/23 2230  Strep pneumoniae urinary antigen  (COPD / Pneumonia / Cellulitis / Lower Extremity Wound)  Once,   R        07/20/23 2232            Total time spent in review of labs and imaging, patient evaluation, formulation of plan, documentation and communication with family: 45 minutes  Signed, Lorin Glass, MD [facility] 07/23/2023

## 2023-07-23 NOTE — Plan of Care (Signed)
  Problem: Education: Goal: Knowledge of General Education information will improve Description: Including pain rating scale, medication(s)/side effects and non-pharmacologic comfort measures Outcome: Not Progressing   Problem: Health Behavior/Discharge Planning: Goal: Ability to manage health-related needs will improve Outcome: Not Progressing   

## 2023-07-24 DIAGNOSIS — J9601 Acute respiratory failure with hypoxia: Secondary | ICD-10-CM | POA: Diagnosis not present

## 2023-07-24 LAB — GLUCOSE, CAPILLARY
Glucose-Capillary: 98 mg/dL (ref 70–99)
Glucose-Capillary: 213 mg/dL — ABNORMAL HIGH (ref 70–99)

## 2023-07-24 MED ORDER — APIXABAN 5 MG PO TABS: 5.0000 mg | ORAL_TABLET | Freq: Two times a day (BID) | ORAL | 0 refills | Status: AC

## 2023-07-24 MED ORDER — SENNOSIDES-DOCUSATE SODIUM 8.6-50 MG PO TABS: 1.0000 | ORAL_TABLET | Freq: Two times a day (BID) | ORAL | 0 refills | Status: AC

## 2023-07-24 MED ORDER — METOPROLOL SUCCINATE ER 50 MG PO TB24: 50.0000 mg | ORAL_TABLET | Freq: Every day | ORAL | 0 refills | Status: AC

## 2023-07-24 MED ORDER — AMOXICILLIN-POT CLAVULANATE 875-125 MG PO TABS: 1.0000 | ORAL_TABLET | Freq: Two times a day (BID) | ORAL | 0 refills | Status: AC

## 2023-07-24 MED ORDER — SACCHAROMYCES BOULARDII 250 MG PO CAPS: 250.0000 mg | ORAL_CAPSULE | Freq: Two times a day (BID) | ORAL | 0 refills | Status: AC

## 2023-07-24 MED ORDER — TAMSULOSIN HCL 0.4 MG PO CAPS: 0.4000 mg | ORAL_CAPSULE | Freq: Every day | ORAL | 0 refills | Status: AC

## 2023-07-24 NOTE — TOC Progression Note (Addendum)
Transition of Care [facility]) - Progression Note    Patient Details  Name: Dylan Park MRN: [id number] Date of Birth: [birth year]  [facility] CM/SW Contact  Howell Rucks, RN Phone Number: 07/24/2023, 9:35 AM  Clinical Narrative: Call received from Carollee Herter with [facility], confirmed patient receives HHA services a couple of days a week, no skilled services. Thayer Ohm requests to be notified of pt's dc to schedule follow up and request DC summary to be faxed to [phone]. TOC will continue to follow.     -9:48am [facility] rep-Ashley, notified pt's spouse declined SNF, pt will resume home OT/PT with HHA services through [employer].     Expected Discharge Plan: Home/Self Care Barriers to Discharge: Continued Medical Work up  Expected Discharge Plan and Services       Living arrangements for the past 2 months: Single Family Home                                       Social Determinants of Health (SDOH) Interventions SDOH Screenings   Food Insecurity: Unknown (06/03/2023)   Received from [facility]  Utilities: Unknown (06/03/2023)   Received from [facility]  Social Connections: Unknown (08/06/2022)   Received from [facility]  Tobacco Use: Medium Risk (07/20/2023)    Readmission Risk Interventions    07/21/2023    2:19 PM  Readmission Risk Prevention Plan  Post Dischage Appt Complete  Medication Screening Complete  Transportation Screening Complete

## 2023-07-24 NOTE — TOC Transition Note (Addendum)
Transition of Care [facility]) - CM/SW Discharge Note   Patient Details  Name: Dylan Park MRN: [id number] Date of Birth: 05/05/1951  Transition of Care [facility]) CM/SW Contact:  Howell Rucks, RN Phone Number: 07/24/2023, 1:09 PM   Clinical Narrative: DC order to home. NCM notified pt's wife via Spanish video interpreter, reports she has no transportation available, will transport pt via [facility], wife reports she will be home all day. [facility] called. No further TOC needs identified.     -1:22pm DC Summary faxed to Carollee Herter with [facility] at [phone] per his request.      Final next level of care: Home w Home Health Services Barriers to Discharge: Barriers Resolved   Patient Goals and CMS Choice CMS Medicare.gov Compare Post Acute Care list provided to:: Patient Represenative (must comment) [facility] (Spouse)) Choice offered to / list presented to : Spouse [facility] (Spouse))  Discharge Placement                  Patient to be transferred to facility by: [facility]   Patient and family notified of of transfer: 07/24/23  Discharge Plan and Services Additional resources added to the After Visit Summary for                            [facility] Arranged: PT, Nurse's Aide HH Agency: [facility] ([facility] Date HH Agency Contacted: 07/24/23 Time HH Agency Contacted: 1305 Representative spoke with at [facility] Agency: Morrie Sheldon  Social Determinants of Health (SDOH) Interventions SDOH Screenings   Food Insecurity: Unknown (06/03/2023)   Received from [facility]  Utilities: Unknown (06/03/2023)   Received from [facility]  Social Connections: Unknown (08/06/2022)   Received from [facility]  Tobacco Use: Medium Risk (07/20/2023)     Readmission Risk Interventions    07/21/2023    2:19 PM  Readmission Risk Prevention Plan  Post Dischage Appt Complete  Medication Screening Complete  Transportation Screening Complete

## 2023-07-24 NOTE — Discharge Summary (Addendum)
Physician Discharge Summary  Dylan Park ZOX:[id number] DOB: 1951/06/06 DOA: 07/20/2023  PCP: Elijio Miles., MD  Admit date: 07/20/2023 Discharge date: 07/24/2023  Admitted From: Home Discharge disposition: Home with home health  Recommendations at discharge:  Complete course of antibiotics with oral Augmentin for neck 7 days with probiotics. Encourage compliance to bowel regimen to avoid constipation and probably urinary retention Follow-up with [facility] as an outpatient for voiding trial.  Brief narrative: Dylan Park is a 72 y.o. 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  In the ED, afebrile, hemodynamically stable, O2 sat dropped to 86% on room air requiring supplemental oxygen CT head did not show acute intracranial findings, showed multiple old infarcts Chest x-ray showed increased markings in the posterior left lung CT abdomen pelvis -Dilation of the collecting systems and ureters on both sides, marked distention of urinary bladder, no renal or ureteral stone -Moderate stool burden in colon and rectum -Increased markings in the posterior left lower lung  Foley catheter was inserted with drainage of 2 L of clear urine. Patient was started on IV antibiotics Admitted to [facility]  Subjective: Patient was seen and examined this morning.   Propped up in bed.  Has baseline expressive aphasia.  Able to feed himself.   I met with and updated his wife at bedside yesterday.  Prepared for discharge today.  Hospital course: Acute respiratory failure with hypoxia  Left lower lobe pneumonia Dysphagia Patient has dysphagia at baseline due to stroke in 2022. Presented with multiple days of vomiting followed by shortness of  breath, probably aspirated Initial chest x-ray with posterior left lower lung opacities Gradually improving on IV Unasyn. Switch to oral Augmentin today to complete 10-day course of antibiotics probiotics. Initially required 2 L oxygen by nasal cannula.  Currently remains on the same. Seen by speech on 7/23.  Started on dysphagia 3 diet.  Acute urinary retention BPH s/p TURP CT abdomen and pelvis showed marked distention of bladder and bilateral hydronephrosis without stone Foley catheter was inserted with drainage of 2 L of clear urine Acute retention could have been secondary to constipation. Flomax 0.4 mg daily started Given his severely distended bladder had presentation for unknown duration, I would keep the Foley catheter in place for next few days and plan for voiding trial as an outpatient with urology. [facility] at [facility] urology.  Constipation Moderate stool burden noted in colon and rectum Likely because of urinary retention. Bowel regimen to continue scheduled Senokot twice daily, MiraLAX daily and Dulcolax as needed    Paroxysmal atrial fibrillation: Rate controlled on Lopressor  Continue chronic anticoagulation with Eliquis.   H/o CVA, HLD Residual right hemiparesis, mild cognitive impairment, dysphagia, aphasia Deficits at baseline Continue Eliquis and Crestor.  Type 2 diabetes mellitus A1c 6.3 in 7/21 PTA meds-insulin aspart, metformin 500 mg twice daily Continue same post discharge. Recent Labs  Lab 07/23/23 1149 07/23/23 1645 07/23/23 2057 07/24/23 0748 07/24/23 1217  GLUCAP 168* 118* 139* 98 213*   Hypertension Controlled on Lopressor.   Impaired mobility  PT eval obtained.  SNF recommended.  Case management was involved.  Wife declined SNF.  Plan is to discharge home today.  Home with PT OT resumed.  Goals of care   Code Status: Full Code  Wounds:  - Pressure Injury 07/17/21 Ankle Right;Lateral Unstageable - Full thickness tissue loss in  which the base of the injury is covered by slough (yellow, tan, gray, green or brown) and/or eschar (tan, brown or black) in the wound bed. (Active)  Date First Assessed/Time First Assessed: 07/17/21 2056   Location: Ankle  Location Orientation: Right;Lateral  Staging: Unstageable - Full thickness tissue loss in which the base of the injury is covered by slough (yellow, tan, gray, green or brown) a...    Assessments 07/17/2021 10:19 PM 07/23/2021  9:23 AM  Dressing Type Foam - Lift dressing to assess site every shift Foam - Lift dressing to assess site every shift  Dressing -- Clean;Dry;Intact  Drainage Amount None None     No associated orders.    Discharge Exam:   Vitals:   07/23/23 1135 07/23/23 1147 07/23/23 2013 07/24/23 0533  BP: (!) 145/79 (!) 145/79 (!) 146/78 (!) 130/57  Pulse: 73 77 92 60  Resp:  18 19 18   Temp:  98.6 F (37 C) 98.2 F (36.8 C) 98.1 F (36.7 C)  TempSrc:  Oral Oral Oral  SpO2:  96% 94% 98%  Weight:      Height:        Body mass index is 27 kg/m.   General exam: Pleasant Caucasian male.  Not in physical distress Skin: No rashes, lesions or ulcers. HEENT: Atraumatic, normocephalic, no obvious bleeding Lungs: Clear to auscultation bilaterally CVS: Regular rate and rhythm, no murmur GI/Abd soft, nontender, nondistended, bowel sound present CNS: Alert, awake, significant course of aphasia, right hemiparesis Psychiatry: Frustrated because of aphasia Extremities: No pedal edema, no calf tenderness  Follow ups:    Follow-up Information     [facility] Follow up.   Why: Home Health Physical Therapy/ Occupational Therapy/Home Health Aide [address] [place] [address] [phone]         Elijio Miles., MD Follow up.   Specialty: Family Medicine Contact information: [address] [place] [address] [phone]         [facility] Follow up.    Contact information: [address] [place] [address] [place] [address] [phone]                Discharge Instructions:   Discharge Instructions     Call MD for:  difficulty breathing, headache or visual disturbances   Complete by: As directed    Call MD for:  extreme fatigue   Complete by: As directed    Call MD for:  hives   Complete by: As directed    Call MD for:  persistant dizziness or light-headedness   Complete by: As directed    Call MD for:  persistant nausea and vomiting   Complete by: As directed    Call MD for:  severe uncontrolled pain   Complete by: As directed    Call MD for:  temperature >100.4   Complete by: As directed    Diet general   Complete by: As directed    Discharge instructions   Complete by: As directed    Recommendations at discharge:   Complete course of antibiotics with oral Augmentin for neck 7 days with probiotics.  Encourage compliance to bowel regimen to avoid constipation and probably urinary retention  Follow[facility]up with [facility] as an outpatient for voiding trial.  General discharge instructions: Follow with Primary MD Elijio Miles., MD in 7  days  Please request your PCP  to go over your hospital tests, procedures, radiology results at the follow up. Please get your medicines reviewed and adjusted.  Your PCP may decide to repeat certain labs or tests as needed. Do not drive, operate heavy machinery, perform activities at heights, swimming or participation in water activities or provide baby sitting services if your were admitted for syncope or siezures until you have seen by Primary MD or a Neurologist and advised to do so again. [place] Controlled Substance Reporting System database was reviewed. Do not drive, operate heavy machinery, perform activities at heights, swim, participate in water activities or provide baby-sitting services while on medications for pain, sleep and mood until your outpatient  physician has reevaluated you and advised to do so again.  You are strongly recommended to comply with the dose, frequency and duration of prescribed medications. Activity: As tolerated with Full fall precautions use walker/cane & assistance as needed Avoid using any recreational substances like cigarette, tobacco, alcohol, or non-prescribed drug. If you experience worsening of your admission symptoms, develop shortness of breath, life threatening emergency, suicidal or homicidal thoughts you must seek medical attention immediately by calling 911 or calling your MD immediately  if symptoms less severe. You must read complete instructions/literature along with all the possible adverse reactions/side effects for all the medicines you take and that have been prescribed to you. Take any new medicine only after you have completely understood and accepted all the possible adverse reactions/side effects.  Wear Seat belts while driving. You were cared for by a hospitalist during your hospital stay. If you have any questions about your discharge medications or the care you received while you were in the hospital after you are discharged, you can call the unit and ask to speak with the hospitalist or the covering physician. Once you are discharged, your primary care physician will handle any further medical issues. Please note that NO REFILLS for any discharge medications will be authorized once you are discharged, as it is imperative that you return to your primary care physician (or establish a relationship with a primary care physician if you do not have one).   Increase activity slowly   Complete by: As directed        Discharge Medications:   Allergies as of 07/24/2023       Reactions   Lisinopril Other (See Comments)   Low heart rate and BP Other Reaction(s): Other Low heart rate and BP  Low heart rate and BP   Penicillins Rash        Medication List     TAKE these medications     amoxicillin-clavulanate 875-125 MG tablet Commonly known as: AUGMENTIN Take 1 tablet by mouth 2 (two) times daily for 7 days.   apixaban 5 MG Tabs tablet Commonly known as: ELIQUIS Take 1 tablet (5 mg total) by mouth 2 (two) times daily.   INSULIN ASPART [place] Inject into the skin.   metFORMIN 500 MG tablet Commonly known as: GLUCOPHAGE Take 500 mg by mouth 2 (two) times daily.   metoprolol succinate 50 MG 24 hr tablet Commonly known as: Toprol XL Take 1 tablet (50 mg total) by mouth daily. Take with or immediately following a meal. What changed:  medication strength how much to take   rosuvastatin 40 MG tablet Commonly known as: CRESTOR Take 40 mg by mouth daily.   saccharomyces boulardii 250 MG capsule Commonly known as: FLORASTOR Take 1 capsule (250 mg total) by  mouth 2 (two) times daily for 7 days.   senna-docusate 8.6-50 MG tablet Commonly known as: Senokot-S Take 1 tablet by mouth 2 (two) times daily.   tamsulosin 0.4 MG Caps capsule Commonly known as: FLOMAX Take 1 capsule (0.4 mg total) by mouth daily. Start taking on: July 25, 2023         The results of significant diagnostics from this hospitalization (including imaging, microbiology, ancillary and laboratory) are listed below for reference.    Procedures and Diagnostic Studies:   DG Chest 2 View  Result Date: 07/20/2023 CLINICAL DATA:  Hypoxia, nausea, vomiting EXAM: CHEST - 2 VIEW COMPARISON:  06/01/2023 FINDINGS: Transverse diameter of heart is increased. Thoracic aorta is tortuous and ectatic. Subtle increase in markings are seen in the posterior left lower lung field. There is no pleural effusion or pneumothorax. Arterial calcifications are seen. IMPRESSION: Cardiomegaly. There are no signs of pulmonary edema. Subtle increased markings in left lower lung field may suggest atelectasis/pneumonia. Electronically Signed   By: Ernie Avena M.D.   On: 07/20/2023 15:53   CT ABDOMEN PELVIS W  CONTRAST  Result Date: 07/20/2023 CLINICAL DATA:  Abdominal pain, nausea, vomiting EXAM: CT ABDOMEN AND PELVIS WITH CONTRAST TECHNIQUE: Multidetector CT imaging of the abdomen and pelvis was performed using the standard protocol following bolus administration of intravenous contrast. RADIATION DOSE REDUCTION: This exam was performed according to the departmental dose-optimization program which includes automated exposure control, adjustment of the mA and/or kV according to patient size and/or use of iterative reconstruction technique. CONTRAST:  OMNIPAQUE IOHEXOL 300 MG/ML  SOLN COMPARISON:  04/21/2023 FINDINGS: Lower chest: There are patchy infiltrates in the posterior lower lung fields, more so on the left side Coronary artery calcifications are seen. Hepatobiliary: No focal abnormalities are seen in liver. There is no dilation of bile ducts. Gallbladder is not distended. Pancreas: No focal abnormalities are seen. Spleen: Unremarkable. Adrenals/Urinary Tract: There are nodular densities in the adrenals with no significant change. There is prominence of the pelvocaliceal systems and ureters on both sides. There is marked distention of the urinary bladder measuring 22 cm in maximum length. There is no wall thickening in the bladder. There is suggestion of trans urethral resection of prostate. Stomach/Bowel: Stomach is unremarkable. Small bowel loops are not dilated. Appendix is not dilated. There is no significant wall thickening in colon. There is no pericolic stranding. Moderate amount of stool is seen in colon and rectum. Vascular/Lymphatic: Calcifications are seen in aorta and its major branches. Reproductive: There is possible previous trans urethral resection of prostate. Prostatic urethra appears prominent. Other: There is no ascites or pneumoperitoneum. Right inguinal hernia containing fat is seen. There is fluid in the left inguinal region. Umbilical hernia containing fat is seen. Musculoskeletal:  No acute findings are seen. IMPRESSION: There is no evidence of intestinal obstruction or pneumoperitoneum. Appendix is not dilated. There is dilation of collecting systems and ureters on both sides. There is marked distention of urinary bladder. Possibility of bladder outlet obstruction causing bilateral hydronephrosis should be considered. There are no demonstrable opaque renal or ureteral stones. There are small patchy infiltrates in the posterior lower lung fields, more so on the left side suggesting atelectasis/pneumonia. Other findings as described in the body of the report. Electronically Signed   By: Ernie Avena M.D.   On: 07/20/2023 15:51   CT Head Wo Contrast  Result Date: 07/20/2023 CLINICAL DATA:  Altered mental status EXAM: CT HEAD WITHOUT CONTRAST TECHNIQUE: Contiguous axial images were obtained  from the base of the skull through the vertex without intravenous contrast. RADIATION DOSE REDUCTION: This exam was performed according to the departmental dose-optimization program which includes automated exposure control, adjustment of the mA and/or kV according to patient size and/or use of iterative reconstruction technique. COMPARISON:  06/01/2023 FINDINGS: Brain: No acute intracranial findings are seen. There are no signs of bleeding within the cranium. Cortical sulci are prominent. Old infarcts are seen in right parieto-occipital region, left frontal region, left periventricular region and right pons suggesting multiple infarcts. No interval changes are noted. Vascular: Scattered arterial calcifications are seen. Skull: No acute findings are seen. Sinuses/Orbits: Unremarkable. Other: No significant interval changes are noted. IMPRESSION: No acute intracranial findings are seen. Atrophy. Multiple old infarcts as described in the body of the report with no significant change. Electronically Signed   By: Ernie Avena M.D.   On: 07/20/2023 15:36     Labs:   Basic Metabolic  Panel: Recent Labs  Lab 07/20/23 1352 07/21/23 0400 07/22/23 0414 07/23/23 0353 07/24/23 0413  NA 136 138 138 138 138  K 4.2 3.7 3.8 3.4* 3.8  CL 98 104 105 104 103  CO2 27 24 25 22 26   GLUCOSE 119* 121* 107* 98 114*  BUN 15 13 12  6* 10  CREATININE 1.08 0.93 0.89 0.78 0.73  CALCIUM 8.9 8.7* 8.4* 8.6* 8.5*  MG  --   --  1.8 1.7 1.8   GFR Estimated Creatinine Clearance: 87.4 mL/min (by C-G formula based on SCr of 0.73 mg/dL). Liver Function Tests: Recent Labs  Lab 07/20/23 1352  AST 17  ALT 11  ALKPHOS 59  BILITOT 1.0  PROT 6.9  ALBUMIN 3.4*   Recent Labs  Lab 07/20/23 1352  LIPASE 34   No results for input(s): "AMMONIA" in the last 168 hours. Coagulation profile No results for input(s): "INR", "PROTIME" in the last 168 hours.  CBC: Recent Labs  Lab 07/20/23 1352 07/21/23 0400 07/21/23 1928 07/22/23 0414 07/23/23 0353 07/24/23 0413  WBC 9.4 7.8  --  8.7 9.2 9.6  NEUTROABS 6.6  --   --  5.2 5.6 5.8  HGB 14.3 12.9* 13.6 13.0 13.8 12.9*  HCT 42.7 38.6* 41.4 39.3 40.8 38.7*  MCV 92.8 94.6  --  92.9 93.2 94.2  PLT 152 144*  --  139* 170 196   Cardiac Enzymes: No results for input(s): "CKTOTAL", "CKMB", "CKMBINDEX", "TROPONINI" in the last 168 hours. BNP: Invalid input(s): "POCBNP" CBG: Recent Labs  Lab 07/23/23 1149 07/23/23 1645 07/23/23 2057 07/24/23 0748 07/24/23 1217  GLUCAP 168* 118* 139* 98 213*   D-Dimer No results for input(s): "DDIMER" in the last 72 hours. Hgb A1c No results for input(s): "HGBA1C" in the last 72 hours. Lipid Profile No results for input(s): "CHOL", "HDL", "LDLCALC", "TRIG", "CHOLHDL", "LDLDIRECT" in the last 72 hours. Thyroid function studies No results for input(s): "TSH", "T4TOTAL", "T3FREE", "THYROIDAB" in the last 72 hours.  Invalid input(s): "FREET3" Anemia work up No results for input(s): "VITAMINB12", "FOLATE", "FERRITIN", "TIBC", "IRON", "RETICCTPCT" in the last 72 hours. Microbiology No results found for  this or any previous visit (from the past 240 hour(s)).  Time coordinating discharge: 45 minutes  Signed: Ferrell Claiborne  [facility] 07/24/2023, 1:45 PM

## 2023-08-13 ENCOUNTER — Emergency Department (HOSPITAL_COMMUNITY)
Admission: EM | Admit: 2023-08-13 | Discharge: 2023-08-13 | Disposition: A | Discharge status: Home / Self Care | Payer: Medicare HMO | Attending: Emergency Medicine | Admitting: Emergency Medicine

## 2023-08-13 ENCOUNTER — Emergency Department (HOSPITAL_COMMUNITY): Payer: Medicare HMO

## 2023-08-13 DIAGNOSIS — Z7901 Long term (current) use of anticoagulants: Secondary | ICD-10-CM | POA: Insufficient documentation

## 2023-08-13 DIAGNOSIS — T83091A Other mechanical complication of indwelling urethral catheter, initial encounter: Secondary | ICD-10-CM | POA: Insufficient documentation

## 2023-08-13 DIAGNOSIS — Z79899 Other long term (current) drug therapy: Secondary | ICD-10-CM | POA: Insufficient documentation

## 2023-08-13 DIAGNOSIS — R319 Hematuria, unspecified: Secondary | ICD-10-CM | POA: Insufficient documentation

## 2023-08-13 DIAGNOSIS — E119 Type 2 diabetes mellitus without complications: Secondary | ICD-10-CM | POA: Insufficient documentation

## 2023-08-13 DIAGNOSIS — Z794 Long term (current) use of insulin: Secondary | ICD-10-CM | POA: Diagnosis not present

## 2023-08-13 DIAGNOSIS — I10 Essential (primary) hypertension: Secondary | ICD-10-CM | POA: Diagnosis not present

## 2023-08-13 DIAGNOSIS — T839XXA Unspecified complication of genitourinary prosthetic device, implant and graft, initial encounter: Secondary | ICD-10-CM

## 2023-08-13 LAB — CBC WITH DIFFERENTIAL/PLATELET
Abs Immature Granulocytes: 0.02 10*3/uL (ref 0.00–0.07)
Basophils Absolute: 0 10*3/uL (ref 0.0–0.1)
Basophils Relative: 0 %
Eosinophils Absolute: 0.1 10*3/uL (ref 0.0–0.5)
Eosinophils Relative: 1 %
HCT: 45.2 % (ref 39.0–52.0)
Hemoglobin: 14.5 g/dL (ref 13.0–17.0)
Immature Granulocytes: 0 %
Lymphocytes Relative: 23 %
Lymphs Abs: 2.2 10*3/uL (ref 0.7–4.0)
MCH: 30.9 pg (ref 26.0–34.0)
MCHC: 32.1 g/dL (ref 30.0–36.0)
MCV: 96.4 fL (ref 80.0–100.0)
Monocytes Absolute: 0.8 10*3/uL (ref 0.1–1.0)
Monocytes Relative: 9 %
Neutro Abs: 6.5 10*3/uL (ref 1.7–7.7)
Neutrophils Relative %: 67 %
Platelets: 185 10*3/uL (ref 150–400)
RBC: 4.69 MIL/uL (ref 4.22–5.81)
RDW: 13.7 % (ref 11.5–15.5)
WBC: 9.7 10*3/uL (ref 4.0–10.5)
nRBC: 0 % (ref 0.0–0.2)

## 2023-08-13 LAB — COMPREHENSIVE METABOLIC PANEL
ALT: 14 U/L (ref 0–44)
AST: 20 U/L (ref 15–41)
Albumin: 3.5 g/dL (ref 3.5–5.0)
Alkaline Phosphatase: 56 U/L (ref 38–126)
Anion gap: 7 (ref 5–15)
BUN: 24 mg/dL — ABNORMAL HIGH (ref 8–23)
CO2: 27 mmol/L (ref 22–32)
Calcium: 8.9 mg/dL (ref 8.9–10.3)
Chloride: 102 mmol/L (ref 98–111)
Creatinine, Ser: 0.88 mg/dL (ref 0.61–1.24)
GFR, Estimated: >60 mL/min (ref >60)
Glucose, Bld: 127 mg/dL — ABNORMAL HIGH (ref 70–99)
Potassium: 4.3 mmol/L (ref 3.5–5.1)
Sodium: 136 mmol/L (ref 135–145)
Total Bilirubin: 0.6 mg/dL (ref 0.3–1.2)
Total Protein: 6.8 g/dL (ref 6.5–8.1)

## 2023-08-13 LAB — URINALYSIS, ROUTINE W REFLEX MICROSCOPIC
Bacteria, UA: NONE SEEN
Bilirubin Urine: NEGATIVE
Glucose, UA: NEGATIVE mg/dL
Ketones, ur: NEGATIVE mg/dL
Leukocytes,Ua: NEGATIVE
Nitrite: NEGATIVE
Protein, ur: 30 mg/dL — AB
RBC / HPF: >50 RBC/hpf (ref 0–5)
Specific Gravity, Urine: 1.02 (ref 1.005–1.030)
pH: 5 (ref 5.0–8.0)

## 2023-08-13 LAB — LIPASE, BLOOD: Lipase: 34 U/L (ref 11–51)

## 2023-08-13 MED ORDER — IOHEXOL 300 MG/ML  SOLN
100.0000 mL | Freq: Once | INTRAMUSCULAR | Status: AC | PRN
  Administered 2023-08-13: 100 mL via INTRAVENOUS

## 2023-08-13 NOTE — ED Notes (Signed)
Wife contacted and notified of discharge. [facility] called for transport.

## 2023-08-13 NOTE — ED Triage Notes (Signed)
Pt arrives POV for new hematuria through foley catheter that started today. Pt with wife who speaks for patient through interpreter as he has hx of Alzheimer's.

## 2023-08-13 NOTE — Discharge Instructions (Signed)
Your history, exam, workup today revealed your Foley catheter bulb was inflated in the prostate likely contributing some of the discomfort and the bleeding.  The CT scan did not show other new significant abnormalities in the urine did not seem consistent with infection at this time.  The Foley catheter was advanced after deflation and continues to work well without significant discomfort.  He reported symptoms were improved so we feel safe with discharge home.  Please follow-up with your primary doctor.  If any symptoms change or worsen acutely, return to the nearest emergency department.

## 2023-08-13 NOTE — ED Notes (Signed)
[facility] here to transport pt back home. Pt in stable condition, wife notified

## 2023-08-13 NOTE — ED Provider Notes (Signed)
4:52 PM Care assumed from Dr. Earlene Plater.  At time of transfer of care, patient is awaiting for results of CT scan.  If CT and urinalysis and labs reassuring, patient will likely be stable for discharge home.  7:45 PM CT scan returned showing the Foley catheter is inflated in the prostate.  Patient reports it was replaced about a week ago.  He is not reporting significant pain but this may be some of the cause of the bleeding.  Will have the Foley catheter deflated, advanced, and reinflated and if it continues to work well, will discharge home as it is still a recent catheter.  We discussed positive totally replacing it but if there is inflammation from the prostate inflation, I worry we may not be able to get the new catheter back in there and patient agrees with leaving the one currently in place there.  Anticipate discharge after Foley management.  8:01 PM Foley catheter advanced per nursing and patient reports that he is feeling much better.  It is draining well without significant bleeding.  Patient be discharged home for outpatient follow-up.  Clinical Impression: 1. Hematuria, unspecified type   2. Problem with Foley catheter, initial encounter [facility])     Disposition: Discharge  Condition: Good  I have discussed the results, Dx and Tx plan with the pt(& family if present). He/she/they expressed understanding and agree(s) with the plan. Discharge instructions discussed at great length. Strict return precautions discussed and pt &/or family have verbalized understanding of the instructions. No further questions at time of discharge.    New Prescriptions   No medications on file    Follow Up: Elijio Miles., MD [address] [place] [address] [phone]          , Canary Brim, MD 08/13/23 2003

## 2023-08-13 NOTE — ED Provider Notes (Signed)
[place] EMERGENCY DEPARTMENT AT [facility] Provider Note   CSN: [id number] Arrival date & time: 08/13/23  1347     History  Chief Complaint  Patient presents with   Hematuria    Dylan Park is a 72 y.o. male.   Hematuria  72 year old male history of diabetes, hypertension, hyperlipidemia, BPH status post TURP, A-fib on Eliquis, prior stroke with right-sided hemiparesis and cognitive impairment as well as expressive aphasia presenting for hematuria.  Patient is here with his wife, history taken from wife via virtual Spanish interpreter.  She states for about the last week he has had intermittent hematuria.  No clots.  He has Foley catheter in place which she thinks is supposed to come out sometime this month.  He was inserted due to urinary retention during recent hospitalization July 22.  Has not been changed since.  He has not had any fevers but she does think he has some abdominal pain.  He has very limited speech.  He said no change in behavior and is at his neurologic baseline.  He is not Eliquis.  He has not had hematuria before.  He is not currently on antibiotics per wife.  Of note, patient was admitted July 22 for pneumonia and completed course of antibiotics.  Catheter was placed during that admission, appears with plan for outpatient voiding trial and follow-up with urology.     Home Medications Prior to Admission medications   Medication Sig Start Date End Date Taking? Authorizing Provider  apixaban (ELIQUIS) 5 MG TABS tablet Take 1 tablet (5 mg total) by mouth 2 (two) times daily. 07/24/23 08/23/23  Lorin Glass, MD  INSULIN ASPART [place] Inject into the skin.    [provider]  metFORMIN (GLUCOPHAGE) 500 MG tablet Take 500 mg by mouth 2 (two) times daily. 06/29/21   [provider]  metoprolol succinate (TOPROL XL) 50 MG 24 hr tablet Take 1 tablet (50 mg total) by mouth daily. Take with or immediately following a meal. 07/24/23 10/22/23  Dahal,  Melina Schools, MD  rosuvastatin (CRESTOR) 40 MG tablet Take 40 mg by mouth daily. 06/29/21   [provider]  senna-docusate (SENOKOT-S) 8.6-50 MG tablet Take 1 tablet by mouth 2 (two) times daily. 07/24/23 10/22/23  Lorin Glass, MD  tamsulosin (FLOMAX) 0.4 MG CAPS capsule Take 1 capsule (0.4 mg total) by mouth daily. 07/25/23 10/23/23  Lorin Glass, MD      Allergies    Lisinopril and Penicillins    Review of Systems   Review of Systems  Genitourinary:  Positive for hematuria.  Review of systems completed and notable as per HPI.  ROS otherwise negative.   Physical Exam Updated Vital Signs BP 137/64   Pulse 71   Temp 98.4 F (36.9 C) (Oral)   Resp 18   SpO2 97%  Physical Exam Vitals and nursing note reviewed.  Constitutional:      General: He is not in acute distress.    Appearance: He is well-developed.  HENT:     Head: Normocephalic and atraumatic.  Eyes:     Extraocular Movements: Extraocular movements intact.     Conjunctiva/sclera: Conjunctivae normal.     Pupils: Pupils are equal, round, and reactive to light.  Cardiovascular:     Rate and Rhythm: Normal rate and regular rhythm.     Heart sounds: No murmur heard. Pulmonary:     Effort: Pulmonary effort is normal. No respiratory distress.     Breath sounds: Normal breath sounds.  Abdominal:     Palpations: Abdomen is soft.     Tenderness: There is no abdominal tenderness. There is no guarding or rebound.  Genitourinary:    Comments: Foley catheter in place.  Small mount of dark urine in the bag.  Urine in the tubing is clear.  No clots.  No penile trauma or skin changes. Musculoskeletal:        General: No swelling.     Cervical back: Neck supple.  Skin:    General: Skin is warm and dry.     Capillary Refill: Capillary refill takes less than 2 seconds.  Neurological:     Mental Status: He is alert. Mental status is at baseline.     Comments: Right-sided weakness.  Patient aphasic, able to nod.  Careers information officer.  Psychiatric:        Mood and Affect: Mood normal.     ED Results / Procedures / Treatments   Labs (all labs ordered are listed, but only abnormal results are displayed) Labs Reviewed  URINALYSIS, ROUTINE W REFLEX MICROSCOPIC - Abnormal; Notable for the following components:      Result Value   APPearance HAZY (*)    Hgb urine dipstick LARGE (*)    Protein, ur 30 (*)    All other components within normal limits  URINE CULTURE  CBC WITH DIFFERENTIAL/PLATELET  COMPREHENSIVE METABOLIC PANEL  LIPASE, BLOOD    EKG None  Radiology No results found.  Procedures Procedures    Medications Ordered in ED Medications - No data to display  ED Course/ Medical Decision Making/ A&P Clinical Course as of 08/13/23 1727  Thu Aug 13, 2023  1635 Bladder scan of 0, no signs of retention. [JD]    Clinical Course User Index [JD] Laurence Spates, MD                                 Medical Decision Making Amount and/or Complexity of Data Reviewed Labs: ordered. Radiology: ordered.   Medical Decision Making:   Siam Cleary Park is a 72 y.o. male who presented to the ED today with hematuria.  Vital signs reviewed.  On exam he is at his neurologic baseline without changes per family.  Patient's wife reports 1 week of hematuria without clots, he is on anticoagulation.  Differential including mass, stone, infection, irritation from chronic indwelling Foley.  Will obtain lab workup, CT scan evaluate for stone or other cause of his hematuria especially given limited history and concern for abdominal pain from wife.  Consider possible UTI as well.   Patient placed on continuous vitals and telemetry monitoring while in ED which was reviewed periodically.  Reviewed and confirmed nursing documentation for past medical history, family history, social history.  Reassessment and Plan:   Patient remains stable on reassessment.  His bladder scan is 0 without signs of any urinary retention,  Foley catheter is draining appropriately.  On repeat evaluation I do not see any blood in the catheter tubing.  CT scan and lab work are pending.  Handoff given to Dr. Rush Landmark at [facility] with plan to follow up workup.  Patient's presentation is most consistent with acute complicated illness / injury requiring diagnostic workup.           Final Clinical Impression(s) / ED Diagnoses Final diagnoses:  Hematuria, unspecified type    Rx / DC Orders ED Discharge Orders     None  Laurence Spates, MD 08/13/23 [phone]

## 2023-08-14 LAB — URINE CULTURE: Culture: NO GROWTH

## 2024-07-29 DEATH — deceased
